# Patient Record
Sex: Female | Born: 1960 | Race: White | Hispanic: No | Marital: Married | Smoking: Former smoker
Health system: Southern US, Community
[De-identification: ages and names within clinical notes are randomized; demographics above are authoritative.]

## PROBLEM LIST (undated history)

## (undated) DIAGNOSIS — E119 Type 2 diabetes mellitus without complications: Secondary | ICD-10-CM

## (undated) DIAGNOSIS — J45909 Unspecified asthma, uncomplicated: Secondary | ICD-10-CM

## (undated) DIAGNOSIS — J449 Chronic obstructive pulmonary disease, unspecified: Secondary | ICD-10-CM

## (undated) DIAGNOSIS — R000 Tachycardia, unspecified: Secondary | ICD-10-CM

## (undated) DIAGNOSIS — G8929 Other chronic pain: Secondary | ICD-10-CM

## (undated) DIAGNOSIS — I10 Essential (primary) hypertension: Secondary | ICD-10-CM

## (undated) DIAGNOSIS — F419 Anxiety disorder, unspecified: Secondary | ICD-10-CM

## (undated) HISTORY — PX: TUBAL LIGATION: SHX77

## (undated) HISTORY — DX: Anxiety disorder, unspecified: F41.9

## (undated) HISTORY — DX: Tachycardia, unspecified: R00.0

## (undated) HISTORY — DX: Unspecified asthma, uncomplicated: J45.909

## (undated) HISTORY — PX: ECTOPIC PREGNANCY SURGERY: SHX613

## (undated) HISTORY — DX: Other chronic pain: G89.29

---

## 2000-06-18 ENCOUNTER — Emergency Department (HOSPITAL_COMMUNITY): Admission: EM | Admit: 2000-06-18 | Discharge: 2000-06-18 | Payer: Self-pay | Admitting: Emergency Medicine

## 2002-03-11 ENCOUNTER — Encounter: Payer: Self-pay | Admitting: Emergency Medicine

## 2002-03-11 ENCOUNTER — Emergency Department (HOSPITAL_COMMUNITY): Admission: EM | Admit: 2002-03-11 | Discharge: 2002-03-11 | Payer: Self-pay | Admitting: Emergency Medicine

## 2002-05-18 ENCOUNTER — Encounter: Payer: Self-pay | Admitting: Emergency Medicine

## 2002-05-18 ENCOUNTER — Emergency Department (HOSPITAL_COMMUNITY): Admission: EM | Admit: 2002-05-18 | Discharge: 2002-05-18 | Payer: Self-pay | Admitting: Emergency Medicine

## 2004-06-27 ENCOUNTER — Emergency Department (HOSPITAL_COMMUNITY): Admission: EM | Admit: 2004-06-27 | Discharge: 2004-06-27 | Payer: Self-pay | Admitting: Emergency Medicine

## 2004-09-13 ENCOUNTER — Ambulatory Visit (HOSPITAL_COMMUNITY): Admission: RE | Admit: 2004-09-13 | Discharge: 2004-09-13 | Payer: Self-pay | Admitting: Obstetrics

## 2005-01-02 ENCOUNTER — Encounter: Admission: RE | Admit: 2005-01-02 | Discharge: 2005-01-02 | Payer: Self-pay | Admitting: Nephrology

## 2005-01-02 ENCOUNTER — Ambulatory Visit (HOSPITAL_COMMUNITY): Admission: RE | Admit: 2005-01-02 | Discharge: 2005-01-02 | Payer: Self-pay | Admitting: Nephrology

## 2005-01-23 ENCOUNTER — Encounter: Admission: RE | Admit: 2005-01-23 | Discharge: 2005-01-23 | Payer: Self-pay | Admitting: Nephrology

## 2005-10-14 ENCOUNTER — Emergency Department (HOSPITAL_COMMUNITY): Admission: EM | Admit: 2005-10-14 | Discharge: 2005-10-14 | Payer: Self-pay | Admitting: Emergency Medicine

## 2005-10-20 ENCOUNTER — Emergency Department (HOSPITAL_COMMUNITY): Admission: EM | Admit: 2005-10-20 | Discharge: 2005-10-20 | Payer: Self-pay | Admitting: Emergency Medicine

## 2007-02-02 ENCOUNTER — Emergency Department (HOSPITAL_COMMUNITY): Admission: EM | Admit: 2007-02-02 | Discharge: 2007-02-02 | Payer: Self-pay | Admitting: Emergency Medicine

## 2011-11-14 ENCOUNTER — Emergency Department (HOSPITAL_COMMUNITY): Payer: Self-pay

## 2011-11-14 ENCOUNTER — Emergency Department (HOSPITAL_COMMUNITY)
Admission: EM | Admit: 2011-11-14 | Discharge: 2011-11-14 | Disposition: A | Discharge status: Home / Self Care | Payer: Self-pay | Attending: Emergency Medicine | Admitting: Emergency Medicine

## 2011-11-14 ENCOUNTER — Encounter (HOSPITAL_COMMUNITY): Payer: Self-pay | Admitting: *Deleted

## 2011-11-14 ENCOUNTER — Other Ambulatory Visit: Payer: Self-pay

## 2011-11-14 DIAGNOSIS — R000 Tachycardia, unspecified: Secondary | ICD-10-CM | POA: Insufficient documentation

## 2011-11-14 DIAGNOSIS — R0989 Other specified symptoms and signs involving the circulatory and respiratory systems: Secondary | ICD-10-CM | POA: Insufficient documentation

## 2011-11-14 DIAGNOSIS — R0609 Other forms of dyspnea: Secondary | ICD-10-CM | POA: Insufficient documentation

## 2011-11-14 DIAGNOSIS — Z79899 Other long term (current) drug therapy: Secondary | ICD-10-CM | POA: Insufficient documentation

## 2011-11-14 DIAGNOSIS — R0682 Tachypnea, not elsewhere classified: Secondary | ICD-10-CM | POA: Insufficient documentation

## 2011-11-14 DIAGNOSIS — R079 Chest pain, unspecified: Secondary | ICD-10-CM | POA: Insufficient documentation

## 2011-11-14 DIAGNOSIS — J4489 Other specified chronic obstructive pulmonary disease: Secondary | ICD-10-CM | POA: Insufficient documentation

## 2011-11-14 DIAGNOSIS — J40 Bronchitis, not specified as acute or chronic: Secondary | ICD-10-CM

## 2011-11-14 DIAGNOSIS — R0602 Shortness of breath: Secondary | ICD-10-CM | POA: Insufficient documentation

## 2011-11-14 DIAGNOSIS — I10 Essential (primary) hypertension: Secondary | ICD-10-CM | POA: Insufficient documentation

## 2011-11-14 DIAGNOSIS — F17200 Nicotine dependence, unspecified, uncomplicated: Secondary | ICD-10-CM | POA: Insufficient documentation

## 2011-11-14 DIAGNOSIS — J449 Chronic obstructive pulmonary disease, unspecified: Secondary | ICD-10-CM | POA: Insufficient documentation

## 2011-11-14 HISTORY — DX: Essential (primary) hypertension: I10

## 2011-11-14 HISTORY — DX: Chronic obstructive pulmonary disease, unspecified: J44.9

## 2011-11-14 LAB — DIFFERENTIAL
Eosinophils Absolute: 0.2 10*3/uL (ref 0.0–0.7)
Eosinophils Relative: 1 % (ref 0–5)
Lymphocytes Relative: 9 % — ABNORMAL LOW (ref 12–46)
Monocytes Absolute: 0.9 10*3/uL (ref 0.1–1.0)
Monocytes Relative: 6 % (ref 3–12)

## 2011-11-14 LAB — COMPREHENSIVE METABOLIC PANEL
Albumin: 3.5 g/dL (ref 3.5–5.2)
Calcium: 10 mg/dL (ref 8.4–10.5)
Creatinine, Ser: 0.5 mg/dL (ref 0.50–1.10)
Sodium: 132 mEq/L — ABNORMAL LOW (ref 135–145)

## 2011-11-14 LAB — CK TOTAL AND CKMB (NOT AT ARMC)
Relative Index: INVALID (ref 0.0–2.5)
Total CK: 43 U/L (ref 7–177)

## 2011-11-14 LAB — CBC
HCT: 41.6 % (ref 36.0–46.0)
Hemoglobin: 14.7 g/dL (ref 12.0–15.0)
MCV: 98.8 fL (ref 78.0–100.0)
Platelets: 254 10*3/uL (ref 150–400)
RBC: 4.21 MIL/uL (ref 3.87–5.11)
WBC: 14.6 10*3/uL — ABNORMAL HIGH (ref 4.0–10.5)

## 2011-11-14 LAB — POCT I-STAT TROPONIN I: Troponin i, poc: 0.01 ng/mL (ref 0.00–0.08)

## 2011-11-14 MED ORDER — ALBUTEROL SULFATE HFA 108 (90 BASE) MCG/ACT IN AERS
INHALATION_SPRAY | RESPIRATORY_TRACT | Status: AC
  Filled 2011-11-14: qty 6.7

## 2011-11-14 MED ORDER — AZITHROMYCIN 250 MG PO TABS: 250.0000 mg | ORAL_TABLET | Freq: Every day | ORAL | Status: AC

## 2011-11-14 MED ORDER — SODIUM CHLORIDE 0.9 % IV BOLUS (SEPSIS)
1000.0000 mL | Freq: Once | INTRAVENOUS | Status: AC
  Administered 2011-11-14: 1000 mL via INTRAVENOUS

## 2011-11-14 MED ORDER — IOHEXOL 350 MG/ML SOLN
100.0000 mL | Freq: Once | INTRAVENOUS | Status: AC | PRN
  Administered 2011-11-14: 100 mL via INTRAVENOUS

## 2011-11-14 MED ORDER — TRAMADOL HCL 50 MG PO TABS: 50.0000 mg | ORAL_TABLET | Freq: Three times a day (TID) | ORAL | Status: AC | PRN

## 2011-11-14 MED ORDER — ALBUTEROL SULFATE (5 MG/ML) 0.5% IN NEBU
2.5000 mg | INHALATION_SOLUTION | RESPIRATORY_TRACT | Status: AC
  Administered 2011-11-14: 2.5 mg via RESPIRATORY_TRACT
  Filled 2011-11-14: qty 0.5

## 2011-11-14 MED ORDER — TRAMADOL HCL 50 MG PO TABS
50.0000 mg | ORAL_TABLET | Freq: Once | ORAL | Status: AC
  Administered 2011-11-14: 50 mg via ORAL
  Filled 2011-11-14: qty 1

## 2011-11-14 MED ORDER — ALBUTEROL SULFATE HFA 108 (90 BASE) MCG/ACT IN AERS
2.0000 | INHALATION_SPRAY | RESPIRATORY_TRACT | Status: DC | PRN
  Administered 2011-11-14: 2 via RESPIRATORY_TRACT

## 2011-11-14 MED ORDER — PREDNISONE 20 MG PO TABS
60.0000 mg | ORAL_TABLET | Freq: Once | ORAL | Status: AC
  Administered 2011-11-14: 60 mg via ORAL
  Filled 2011-11-14: qty 3

## 2011-11-14 MED ORDER — PREDNISONE 20 MG PO TABS: 60.0000 mg | ORAL_TABLET | Freq: Once | ORAL | Status: AC

## 2011-11-14 MED ORDER — AZITHROMYCIN 250 MG PO TABS
500.0000 mg | ORAL_TABLET | Freq: Once | ORAL | Status: AC
  Administered 2011-11-14: 500 mg via ORAL
  Filled 2011-11-14: qty 2

## 2011-11-14 NOTE — Discharge Instructions (Signed)

## 2011-11-14 NOTE — ED Notes (Signed)
Patient transported to CT 

## 2011-11-14 NOTE — ED Notes (Signed)
The pt has had lt lower rib pain and posterior chest pain  For 2-3 dats with chills and a temp with a productive cough.

## 2011-11-14 NOTE — ED Notes (Signed)
[facility] [phone] called for clarification of Rx for prednisone.  Dr Gwyneth Sprout consulted and Rx for Prednisone changed to "Prednisone 20 mg tablet, take 3 tablets (60 mg) by mouth once daily x 5 days # 15"  Order clarified w/pharmacy

## 2011-11-14 NOTE — ED Notes (Signed)
MD at bedside.- dr. Jeraldine Loots

## 2011-11-14 NOTE — ED Provider Notes (Signed)
History     CSN: [id number]  Arrival date & time 11/14/11  0018   First MD Initiated Contact with Patient 11/14/11 0256      Chief Complaint  Patient presents with  . Chest Pain     HPI The patient presents with chest pain.  She notes her symptoms began approximately 3 days ago, gradually.  Since onset she has had pain focally about the left side of her lower sternum, with mild radiation to the posterior.  The pain is worse with inspiration and coughing.  She notes mild dyspnea, worse than her baseline.  She notes mild improvement with albuterol inhaler.  No clear exacerbating factors beyond the deep inspiration.  She also notes a subjective fever, no objective fever.  No vomiting, no diarrhea.    Past Medical History  Diagnosis Date  . COPD (chronic obstructive pulmonary disease)   . Hypertension     No past surgical history on file.  History reviewed. No pertinent family history.  History  Substance Use Topics  . Smoking status: Current Everyday Smoker  . Smokeless tobacco: Not on file  . Alcohol Use: Yes    OB History    Grav Para Term Preterm Abortions TAB SAB Ect Mult Living                  Review of Systems  Constitutional:       HPI  HENT:       HPI otherwise negative  Eyes: Negative.   Respiratory:       HPI, otherwise negative  Cardiovascular:       HPI, otherwise nmegative  Gastrointestinal: Negative for vomiting.  Genitourinary:       HPI, otherwise negative  Musculoskeletal:       HPI, otherwise negative  Skin: Negative.   Neurological: Negative for syncope.    Allergies  Review of patient's allergies indicates no known allergies.  Home Medications   Current Outpatient Rx  Name Route Sig Dispense Refill  . ALBUTEROL SULFATE HFA 108 (90 BASE) MCG/ACT IN AERS Inhalation Inhale 2 puffs into the lungs every 6 (six) hours as needed.    Marland Kitchen CLONIDINE HCL 0.2 MG PO TABS Oral Take 0.4 mg by mouth 2 (two) times daily.    . TRIAMTERENE-HCTZ 37.5-25  MG PO CAPS Oral Take 1 capsule by mouth every morning.      BP 124/90  Pulse 109  Temp(Src) 97.9 F (36.6 C) (Oral)  Resp 23  SpO2 95%  Physical Exam  Nursing note and vitals reviewed. Constitutional: She is oriented to person, place, and time. She appears well-developed and well-nourished. No distress.  HENT:  Head: Normocephalic and atraumatic.  Eyes: Conjunctivae and EOM are normal.  Cardiovascular: Regular rhythm.  Tachycardia present.   Pulmonary/Chest: No stridor. Tachypnea noted. No respiratory distress. She has wheezes.  Abdominal: She exhibits no distension.  Musculoskeletal: She exhibits no edema.  Neurological: She is alert and oriented to person, place, and time. No cranial nerve deficit.  Skin: Skin is warm and dry.  Psychiatric: She has a normal mood and affect.    ED Course  Procedures (including critical care time)  Labs Reviewed  CBC - Abnormal; Notable for the following:    WBC 14.6 (*)    MCH 34.9 (*)    All other components within normal limits  DIFFERENTIAL - Abnormal; Notable for the following:    Neutrophils Relative 84 (*)    Neutro Abs 12.2 (*)    Lymphocytes  Relative 9 (*)    All other components within normal limits  POCT I-STAT TROPONIN I  COMPREHENSIVE METABOLIC PANEL  CK TOTAL AND CKMB   Dg Chest 2 View  11/14/2011  *RADIOLOGY REPORT*  Clinical Data: Chest pain and shortness of breath.  History of hypertension and emphysema.  CHEST - 2 VIEW  Comparison: 02/02/07  Findings: Normal heart size and pulmonary vascularity. Emphysematous changes and diffuse fibrosis throughout the lungs. There is a vague nodular opacity in the left lower lung, new since the previous study.  This could represent overlapping shadows but developing nodule should be excluded and CT is recommended.  No focal airspace consolidation.  No blunting of costophrenic angles. No pneumothorax.  IMPRESSION: Emphysema and fibrosis in the lungs.  Vague nodular opacity over the left  lung which may represent overlapping shadows but CT is recommended to exclude significant developing pulmonary nodule.  Original Report Authenticated By: Marlon Pel, M.D.   X-ray reviewed by me  No diagnosis found.  Pulse ox 94% room air, abnormal Cardiac monitor: 95 sr - normal   Date: 11/14/2011  Rate: 114  Rhythm: sinus tachycardia  QRS Axis: normal  Intervals: normal  ST/T Wave abnormalities: normal  Conduction Disutrbances:none  Narrative Interpretation:   Old EKG Reviewed: none available  Smoking cessation counseling provided  MDM  This elderly appearing female now presents with several days of cough, pain, chills.  On exam the patient is in no distress, is mildly hypoxic.  The patient improved substantially following albuterol treatment.  The patient's x-ray, story, smoking history oral system with acute bronchitis, likely superimposed on smoking related changes.  The patient's ECG was nonischemic, though it was tachycardic.  The patient was discharged in stable condition to follow up with her primary care physician        Gerhard Munch, MD 11/14/11 [phone]

## 2012-03-26 ENCOUNTER — Other Ambulatory Visit: Payer: Self-pay | Admitting: Nephrology

## 2012-05-14 ENCOUNTER — Other Ambulatory Visit: Payer: Self-pay | Admitting: Family Medicine

## 2012-05-14 ENCOUNTER — Ambulatory Visit
Admission: RE | Admit: 2012-05-14 | Discharge: 2012-05-14 | Disposition: A | Discharge status: Home / Self Care | Payer: PRIVATE HEALTH INSURANCE | Source: Ambulatory Visit | Attending: Family Medicine | Admitting: Family Medicine

## 2012-05-14 DIAGNOSIS — Z0000 Encounter for general adult medical examination without abnormal findings: Secondary | ICD-10-CM

## 2012-05-14 DIAGNOSIS — Z006 Encounter for examination for normal comparison and control in clinical research program: Secondary | ICD-10-CM

## 2014-12-17 ENCOUNTER — Emergency Department (HOSPITAL_COMMUNITY)
Admission: EM | Admit: 2014-12-17 | Discharge: 2014-12-17 | Disposition: A | Discharge status: Home / Self Care | Payer: Medicaid Other | Source: Home / Self Care | Attending: Emergency Medicine | Admitting: Emergency Medicine

## 2014-12-17 ENCOUNTER — Encounter (HOSPITAL_COMMUNITY): Payer: Self-pay | Admitting: *Deleted

## 2014-12-17 ENCOUNTER — Emergency Department (INDEPENDENT_AMBULATORY_CARE_PROVIDER_SITE_OTHER): Payer: Medicaid Other

## 2014-12-17 DIAGNOSIS — J189 Pneumonia, unspecified organism: Secondary | ICD-10-CM

## 2014-12-17 MED ORDER — ACETAMINOPHEN 325 MG PO TABS
ORAL_TABLET | ORAL | Status: AC
  Filled 2014-12-17: qty 2

## 2014-12-17 MED ORDER — ACETAMINOPHEN 325 MG PO TABS
650.0000 mg | ORAL_TABLET | Freq: Once | ORAL | Status: AC
  Administered 2014-12-17: 650 mg via ORAL

## 2014-12-17 MED ORDER — LEVOFLOXACIN 500 MG PO TABS: 500.0000 mg | ORAL_TABLET | Freq: Every day | ORAL | Status: DC

## 2014-12-17 MED ORDER — ALBUTEROL SULFATE (2.5 MG/3ML) 0.083% IN NEBU
INHALATION_SOLUTION | RESPIRATORY_TRACT | Status: AC
  Filled 2014-12-17: qty 6

## 2014-12-17 MED ORDER — PREDNISONE 50 MG PO TABS: ORAL_TABLET | ORAL | Status: DC

## 2014-12-17 MED ORDER — IPRATROPIUM-ALBUTEROL 0.5-2.5 (3) MG/3ML IN SOLN
3.0000 mL | Freq: Once | RESPIRATORY_TRACT | Status: AC
  Administered 2014-12-17: 3 mL via RESPIRATORY_TRACT

## 2014-12-17 MED ORDER — IPRATROPIUM BROMIDE 0.02 % IN SOLN
RESPIRATORY_TRACT | Status: AC
  Filled 2014-12-17: qty 2.5

## 2014-12-17 MED ORDER — ALBUTEROL SULFATE (2.5 MG/3ML) 0.083% IN NEBU
2.5000 mg | INHALATION_SOLUTION | Freq: Once | RESPIRATORY_TRACT | Status: AC
  Administered 2014-12-17: 2.5 mg via RESPIRATORY_TRACT

## 2014-12-17 NOTE — ED Notes (Signed)
Pt      Reports         Shortness  Of  Breath              Cough       And  Congested           Symptoms  X  3  Days               Pain  In  Back         And  Sides as   Well     Pt  Sitting  Upright  On  The  Exam table  Speaking in  Complete  sentances  Pt is  A  Smoker

## 2014-12-17 NOTE — Discharge Instructions (Signed)
You have pneumonia. Take Levaquin daily for 7 days. Take prednisone daily for 5 days. Use your albuterol and Atrovent as needed. Alternate Tylenol and ibuprofen every 4 hours for body aches and fever. If you are having worsening shortness of breath or unable to tolerate the medications, please go to the emergency room. Follow-up with your PCP in about one week for recheck.

## 2014-12-17 NOTE — ED Provider Notes (Signed)
CSN: [id number]     Arrival date & time 12/17/14  1446 History   First MD Initiated Contact with Patient 12/17/14 1614     Chief Complaint  Patient presents with  . Cough   (Consider location/radiation/quality/duration/timing/severity/associated sxs/prior Treatment) HPI  She is a 54 year old woman here for evaluation of cough and body aches. She states for the last 1-2 days she has had fever, cough, body aches. She is a current smoker and has a history of COPD. The cough is productive, but nothing different than her usual. She is more short of breath than normal. She reports wheezing. She's been using her albuterol and Atrovent inhalers with improvement. She has taken some Goody powders with improvement of fever. She also reports sharp pain under the right breast with deep breaths and coughing. She states that she holds the area of the pain is better. The pain will radiate around the right rib cage. She reports some mild nausea, but no vomiting.  Past Medical History  Diagnosis Date  . COPD (chronic obstructive pulmonary disease)   . Hypertension    History reviewed. No pertinent past surgical history. History reviewed. No pertinent family history. History  Substance Use Topics  . Smoking status: Current Every Day Smoker  . Smokeless tobacco: Not on file  . Alcohol Use: Yes   OB History    No data available     Review of Systems  Constitutional: Positive for fever and chills.  HENT: Negative for congestion, rhinorrhea and sore throat.   Respiratory: Positive for cough, shortness of breath and wheezing.   Cardiovascular: Positive for chest pain (with cough).  Gastrointestinal: Positive for nausea. Negative for vomiting.  Musculoskeletal: Positive for myalgias.  Neurological: Negative for headaches.    Allergies  Review of patient's allergies indicates no known allergies.  Home Medications   Prior to Admission medications   Medication Sig Start Date End Date Taking?  Authorizing Provider  acetaminophen (TYLENOL) 325 MG tablet Take 325 mg by mouth every 6 (six) hours as needed. fever    Historical Provider, MD  albuterol (PROVENTIL HFA;VENTOLIN HFA) 108 (90 BASE) MCG/ACT inhaler Inhale 2 puffs into the lungs every 6 (six) hours as needed.    Historical Provider, MD  aspirin 325 MG tablet Take 325 mg by mouth once. fever    Historical Provider, MD  Aspirin-Acetaminophen-Caffeine (GOODY HEADACHE PO) Take 0.5 packets by mouth every 8 (eight) hours as needed. fever    Historical Provider, MD  clonazePAM (KLONOPIN) 0.5 MG tablet Take 0.25 mg by mouth 2 (two) times daily. Scheduled doses    Historical Provider, MD  cloNIDine (CATAPRES) 0.2 MG tablet Take 0.4 mg by mouth at bedtime.     Historical Provider, MD  ibuprofen (ADVIL,MOTRIN) 200 MG tablet Take 200 mg by mouth every 6 (six) hours as needed. fever    Historical Provider, MD  levofloxacin (LEVAQUIN) 500 MG tablet Take 1 tablet (500 mg total) by mouth daily. 12/17/14   Charm Rings, MD  polyvinyl alcohol (LIQUIFILM TEARS) 1.4 % ophthalmic solution Place 1 drop into both eyes as needed. Dry eyes    Historical Provider, MD  predniSONE (DELTASONE) 50 MG tablet Take 1 pill daily for 5 days. 12/17/14   Charm Rings, MD  triamterene-hydrochlorothiazide (DYAZIDE) 37.5-25 MG per capsule Take 1 capsule by mouth every morning.    Historical Provider, MD   BP 144/86 mmHg  Pulse 112  Temp(Src) 100.7 F (38.2 C) (Oral)  Resp 18  SpO2 94% Physical  Exam  Constitutional: She is oriented to person, place, and time. She appears well-developed and well-nourished. She appears distressed (Mild).  HENT:  Nose: Nose normal.  Mouth/Throat: Oropharynx is clear and moist. No oropharyngeal exudate.  Neck: Neck supple.  Cardiovascular: Regular rhythm and normal heart sounds.  Tachycardia present.   No murmur heard. Pulmonary/Chest: Effort normal. No respiratory distress. She has wheezes (Diffuse). She exhibits tenderness (Underneath  right breast).  Lymphadenopathy:    She has no cervical adenopathy.  Neurological: She is alert and oriented to person, place, and time.  Skin: Skin is warm and dry.    ED Course  Procedures (including critical care time) Labs Review Labs Reviewed - No data to display  Imaging Review Dg Chest 2 View  12/17/2014   CLINICAL DATA:  Shortness of breath. Stabbing pains in the right side of the chest. Dry cough.  EXAM: CHEST - 2 VIEW  COMPARISON:  One-view chest 05/14/2012  FINDINGS: The heart size is normal. Right middle lobe airspace disease is new. The left lung is clear. The visualized soft tissues and bony thorax are unremarkable.  IMPRESSION: New right middle lobe pneumonia.   Electronically Signed   By: Marin Robertshristopher  Mattern M.D.   On: 12/17/2014 17:16     MDM   1. CAP (community acquired pneumonia)    DuoNeb 0.5-5 mg given. Tylenol 650 mg given.  She reports improvement after the Tylenol and breathing treatment. She is moving air much better. She does still have some wheezing in the right lower lobe. X-ray shows pneumonia. We'll treat with Levaquin and prednisone. Recommended alternating Tylenol and ibuprofen for fever and body aches. Return precautions reviewed.    Charm RingsErin J Shalah Estelle, MD 12/17/14 1728

## 2014-12-24 ENCOUNTER — Other Ambulatory Visit: Payer: Self-pay | Admitting: Family Medicine

## 2014-12-24 DIAGNOSIS — N889 Noninflammatory disorder of cervix uteri, unspecified: Secondary | ICD-10-CM

## 2015-01-03 ENCOUNTER — Ambulatory Visit
Admission: RE | Admit: 2015-01-03 | Discharge: 2015-01-03 | Disposition: A | Discharge status: Home / Self Care | Payer: Medicaid Other | Source: Ambulatory Visit | Attending: Family Medicine | Admitting: Family Medicine

## 2015-01-03 DIAGNOSIS — N889 Noninflammatory disorder of cervix uteri, unspecified: Secondary | ICD-10-CM

## 2015-03-24 ENCOUNTER — Encounter: Payer: Self-pay | Admitting: *Deleted

## 2015-03-25 ENCOUNTER — Encounter: Payer: Self-pay | Admitting: Internal Medicine

## 2015-03-25 ENCOUNTER — Ambulatory Visit (INDEPENDENT_AMBULATORY_CARE_PROVIDER_SITE_OTHER): Payer: Medicaid Other | Admitting: Internal Medicine

## 2015-03-25 VITALS — BP 122/62 | HR 104 | Ht 59.0 in | Wt 108.8 lb

## 2015-03-25 DIAGNOSIS — F17200 Nicotine dependence, unspecified, uncomplicated: Secondary | ICD-10-CM | POA: Insufficient documentation

## 2015-03-25 DIAGNOSIS — Z720 Tobacco use: Secondary | ICD-10-CM | POA: Diagnosis not present

## 2015-03-25 DIAGNOSIS — Z8701 Personal history of pneumonia (recurrent): Secondary | ICD-10-CM

## 2015-03-25 DIAGNOSIS — R0600 Dyspnea, unspecified: Secondary | ICD-10-CM

## 2015-03-25 DIAGNOSIS — J431 Panlobular emphysema: Secondary | ICD-10-CM | POA: Diagnosis not present

## 2015-03-25 DIAGNOSIS — R0689 Other abnormalities of breathing: Secondary | ICD-10-CM

## 2015-03-25 MED ORDER — PREDNISONE 10 MG PO TABS: ORAL_TABLET | ORAL | Status: DC

## 2015-03-25 NOTE — Progress Notes (Signed)
Subjective:    Patient ID: Sonia Bush, female    DOB: 1961-09-20, 54 y.o.   MRN: [id number]  PCP Aida Puffer, MD   HPI  IOV 03/25/2015  Chief Complaint  Patient presents with  . Pulmonary Consult    Pt referred by Dr. Aida Puffer for DOE. Pt states she has COPD.    [name] referred by primary care physician for evaluation of COPD. Also chart reviewed but there is no specific information. Patient herself reports a diagnosis of COPD/emphysema many years ago. She says that for the last 20 years or so she's had chronic insidious onset of shortness of breath, cough, wheezing, chest tightness, white sputum production. All these have been steadily slowly progressive over the last several years. Currently symptoms are moderate to severe in intensity. There is no acute decompensation or colored sputum. Her husband was a patient of mine [employer] and he recently passed away from sarcoma of the lung. I was the original pulmonologist who did the biopsy on him. Therefore she has opted to follow-up with me for this new evaluation. She does not have any chest pain associated with these.    Radiologic evaluation shows right middle lobe pneumonia March 2016 on a chest x-ray that I personally visualized  In February 2013 she did have CT angiographic of the chest that confirms a significant emphysema in addition she had some left-sided infiltrates but I personally visualized.  She continues to smoke actively          has a past medical history of COPD (chronic obstructive pulmonary disease); Hypertension; Anxiety; Chronic pain; and Asthmatic bronchitis.   reports that she has been smoking Cigarettes.  She has a 19.5 pack-year smoking history. She has never used smokeless tobacco.  Past Surgical History  Procedure Laterality Date  . Ectopic pregnancy surgery    . Tubal ligation      No Known Allergies  Immunization History  Administered Date(s) Administered  .  Influenza Split 08/01/2014    Family History  Problem Relation Age of Onset  . Asthma Daughter   . Asthma Daughter   . Skin cancer Father   . Alzheimer's disease Father      Current outpatient prescriptions:  .  acetaminophen (TYLENOL) 325 MG tablet, Take 325 mg by mouth every 6 (six) hours as needed. fever, Disp: , Rfl:  .  albuterol (PROVENTIL HFA;VENTOLIN HFA) 108 (90 BASE) MCG/ACT inhaler, Inhale 2 puffs into the lungs every 6 (six) hours as needed., Disp: , Rfl:  .  Aspirin-Acetaminophen-Caffeine (GOODY HEADACHE PO), Take 0.5 packets by mouth every 8 (eight) hours as needed. fever, Disp: , Rfl:  .  clonazePAM (KLONOPIN) 0.5 MG tablet, Take 0.25 mg by mouth 2 (two) times daily. Scheduled doses, Disp: , Rfl:  .  cloNIDine (CATAPRES) 0.2 MG tablet, Take 0.4 mg by mouth at bedtime. , Disp: , Rfl:  .  ibuprofen (ADVIL,MOTRIN) 200 MG tablet, Take 200 mg by mouth every 6 (six) hours as needed. fever, Disp: , Rfl:  .  ipratropium (ATROVENT HFA) 17 MCG/ACT inhaler, Inhale 2 puffs into the lungs every 6 (six) hours., Disp: , Rfl:  .  polyvinyl alcohol (LIQUIFILM TEARS) 1.4 % ophthalmic solution, Place 1 drop into both eyes as needed. Dry eyes, Disp: , Rfl:  .  QUEtiapine (SEROQUEL) 25 MG tablet, Take 25 mg by mouth at bedtime., Disp: , Rfl:  .  triamterene-hydrochlorothiazide (DYAZIDE) 37.5-25 MG per capsule, Take 1 capsule by mouth every morning., Disp: ,  Rfl:     Review of Systems  Constitutional: Negative for fever and unexpected weight change.  HENT: Negative for congestion, dental problem, ear pain, nosebleeds, postnasal drip, rhinorrhea, sinus pressure, sneezing, sore throat and trouble swallowing.   Eyes: Negative for redness and itching.  Respiratory: Positive for cough, chest tightness and shortness of breath. Negative for wheezing.   Cardiovascular: Negative for palpitations and leg swelling.  Gastrointestinal: Negative for nausea and vomiting.  Genitourinary: Negative for  dysuria.  Musculoskeletal: Negative for joint swelling.  Skin: Negative for rash.  Neurological: Negative for headaches.  Hematological: Does not bruise/bleed easily.  Psychiatric/Behavioral: Negative for dysphoric mood. The patient is not nervous/anxious.        Objective:   Physical Exam  Constitutional: She is oriented to person, place, and time. She appears well-developed and well-nourished. No distress.  HENT:  Head: Normocephalic and atraumatic.  Right Ear: External ear normal.  Left Ear: External ear normal.  Mouth/Throat: Oropharynx is clear and moist. No oropharyngeal exudate.  Eyes: Conjunctivae and EOM are normal. Pupils are equal, round, and reactive to light. Right eye exhibits no discharge. Left eye exhibits no discharge. No scleral icterus.  Neck: Normal range of motion. Neck supple. No JVD present. No tracheal deviation present. No thyromegaly present.  Cardiovascular: Normal rate, regular rhythm, normal heart sounds and intact distal pulses.  Exam reveals no gallop and no friction rub.   No murmur heard. Pulmonary/Chest: Effort normal. No respiratory distress. She has wheezes. She has no rales. She exhibits no tenderness.  Scattered wheeze +  Abdominal: Soft. Bowel sounds are normal. She exhibits no distension and no mass. There is no tenderness. There is no rebound and no guarding.  Musculoskeletal: Normal range of motion. She exhibits no edema or tenderness.  Lymphadenopathy:    She has no cervical adenopathy.  Neurological: She is alert and oriented to person, place, and time. She has normal reflexes. No cranial nerve deficit. She exhibits normal muscle tone. Coordination normal.  Skin: Skin is warm and dry. No rash noted. She is not diaphoretic. No erythema. No pallor.  Psychiatric: She has a normal mood and affect. Her behavior is normal. Judgment and thought content normal.  Vitals reviewed.   Filed Vitals:   03/25/15 1425  BP: 122/62  Pulse: 104  Height:   (1.499 m)  Weight: 108 lb 12.8 oz (49.351 kg)  SpO2: 93%         Assessment & Plan:     ICD-9-CM ICD-10-CM   1. Dyspnea and respiratory abnormality 786.09 R06.00     R06.89   2. 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 Smoking 305.1 Z72.0   4. History of pneumonia V12.61 Z87.01    Has copd/emphysema  - likelyseverey stage. Actively symptomatic due to lack of maintenance Rx.   Do full PFT next week or so Do CT chest wo contrast next week or so Do Please take prednisone 40 mg x1 day, then 30 mg x1 day, then 20 mg x1 day, then 10 mg x1 day, and then 5 mg x1 day and stop No change in current inhaler therapy of atrovent but change at followup based on relief with pred and PFT/CT results Slowly work on quitting smoking  Followup  1 to 2 weeks after completion of above with me or my NP Tammy Parrett Walk test at followup   Dr. Kalman Shan, M.D., [facility].C.P Pulmonary and Critical Care Medicine Staff Physician [place]  [facility] [place] [facility] Pager:  [phone], If no answer or between  15:00h - 7:00h: call [phone]  03/25/2015 2:54 PM

## 2015-03-25 NOTE — Patient Instructions (Addendum)
ICD-9-CM ICD-10-CM   1. Dyspnea and respiratory abnormality 786.09 R06.00     R06.89   2. 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 Smoking 305.1 Z72.0   4. History of pneumonia V12.61 Z87.01     Do COPD CAT score Do full PFT next week or so Do CT chest wo contrast next week or so Do Please take prednisone 40 mg x1 day, then 30 mg x1 day, then 20 mg x1 day, then 10 mg x1 day, and then 5 mg x1 day and stop No change in current inhaler therapy of atrovent but change at followup based on relief with pred and PFT/CT results  Slowly work on quitting smoking  Followup  1 to 2 weeks after completion of above with me or my NP Tammy Parrett Walk test at followup

## 2015-03-30 ENCOUNTER — Ambulatory Visit (INDEPENDENT_AMBULATORY_CARE_PROVIDER_SITE_OTHER)
Admission: RE | Admit: 2015-03-30 | Discharge: 2015-03-30 | Disposition: A | Discharge status: Home / Self Care | Payer: Medicaid Other | Source: Ambulatory Visit | Attending: Internal Medicine | Admitting: Internal Medicine

## 2015-03-30 DIAGNOSIS — Z720 Tobacco use: Secondary | ICD-10-CM

## 2015-03-30 DIAGNOSIS — Z8701 Personal history of pneumonia (recurrent): Secondary | ICD-10-CM

## 2015-03-30 DIAGNOSIS — R0600 Dyspnea, unspecified: Secondary | ICD-10-CM

## 2015-03-30 DIAGNOSIS — R0689 Other abnormalities of breathing: Secondary | ICD-10-CM

## 2015-03-30 DIAGNOSIS — F17200 Nicotine dependence, unspecified, uncomplicated: Secondary | ICD-10-CM

## 2015-03-30 DIAGNOSIS — J431 Panlobular emphysema: Secondary | ICD-10-CM

## 2015-03-31 ENCOUNTER — Other Ambulatory Visit (HOSPITAL_COMMUNITY): Payer: Self-pay | Admitting: Respiratory Therapy

## 2015-03-31 ENCOUNTER — Encounter (HOSPITAL_COMMUNITY): Payer: Medicaid Other

## 2015-03-31 ENCOUNTER — Ambulatory Visit (HOSPITAL_COMMUNITY)
Admission: RE | Admit: 2015-03-31 | Discharge: 2015-03-31 | Disposition: A | Discharge status: Home / Self Care | Payer: Medicaid Other | Source: Ambulatory Visit | Attending: Internal Medicine | Admitting: Internal Medicine

## 2015-03-31 ENCOUNTER — Ambulatory Visit (HOSPITAL_COMMUNITY): Admission: RE | Admit: 2015-03-31 | Payer: Medicaid Other | Source: Ambulatory Visit

## 2015-03-31 DIAGNOSIS — R0600 Dyspnea, unspecified: Secondary | ICD-10-CM

## 2015-03-31 DIAGNOSIS — J439 Emphysema, unspecified: Secondary | ICD-10-CM

## 2015-03-31 DIAGNOSIS — R0689 Other abnormalities of breathing: Principal | ICD-10-CM

## 2015-03-31 MED ORDER — ALBUTEROL SULFATE (2.5 MG/3ML) 0.083% IN NEBU: 2.5000 mg | INHALATION_SOLUTION | Freq: Once | RESPIRATORY_TRACT | Status: DC

## 2015-03-31 MED ORDER — ALBUTEROL SULFATE (2.5 MG/3ML) 0.083% IN NEBU: 2.5000 mg | INHALATION_SOLUTION | Freq: Once | RESPIRATORY_TRACT | Status: AC

## 2015-03-31 NOTE — Progress Notes (Signed)
Respiratory  PFT note  Unable to order neb tx for pft study.  Pharmacy called.  They placed an order. Order of mar as read only.  Had some admitting and ordering issues prior to PFT study.

## 2015-03-31 NOTE — Progress Notes (Signed)
Quick Note:  ATC pt. VM box full. WCB. ______

## 2015-04-05 LAB — PULMONARY FUNCTION TEST
DL/VA % pred: 42 %
DL/VA: 1.78 ml/min/mmHg/L
DLCO unc % pred: 25 %
DLCO unc: 4.57 ml/min/mmHg
FEF 25-75 POST: 0.32 L/s
FEF 25-75 PRE: 0.18 L/s
FEF2575-%Change-Post: 84 %
FEF2575-%Pred-Post: 14 %
FEF2575-%Pred-Pre: 7 %
FEV1-%Change-Post: 29 %
FEV1-%PRED-POST: 29 %
FEV1-%Pred-Pre: 22 %
FEV1-PRE: 0.51 L
FEV1-Post: 0.66 L
FEV1FVC-%Change-Post: -12 %
FEV1FVC-%PRED-PRE: 48 %
FEV6-%CHANGE-POST: 37 %
FEV6-%PRED-PRE: 41 %
FEV6-%Pred-Post: 56 %
FEV6-POST: 1.58 L
FEV6-PRE: 1.15 L
FEV6FVC-%Change-Post: -7 %
FEV6FVC-%PRED-POST: 82 %
FEV6FVC-%Pred-Pre: 89 %
FVC-%CHANGE-POST: 48 %
FVC-%PRED-POST: 68 %
FVC-%Pred-Pre: 45 %
FVC-Post: 1.97 L
FVC-Pre: 1.33 L
POST FEV1/FVC RATIO: 34 %
POST FEV6/FVC RATIO: 80 %
PRE FEV1/FVC RATIO: 39 %
PRE FEV6/FVC RATIO: 87 %

## 2015-04-08 ENCOUNTER — Encounter: Payer: Self-pay | Admitting: Internal Medicine

## 2015-04-08 ENCOUNTER — Ambulatory Visit (INDEPENDENT_AMBULATORY_CARE_PROVIDER_SITE_OTHER): Payer: Medicaid Other | Admitting: Internal Medicine

## 2015-04-08 VITALS — BP 142/94 | HR 114 | Ht 59.0 in | Wt 109.0 lb

## 2015-04-08 DIAGNOSIS — F17200 Nicotine dependence, unspecified, uncomplicated: Secondary | ICD-10-CM

## 2015-04-08 DIAGNOSIS — Z720 Tobacco use: Secondary | ICD-10-CM

## 2015-04-08 DIAGNOSIS — Z23 Encounter for immunization: Secondary | ICD-10-CM

## 2015-04-08 DIAGNOSIS — J449 Chronic obstructive pulmonary disease, unspecified: Secondary | ICD-10-CM

## 2015-04-08 MED ORDER — TIOTROPIUM BROMIDE MONOHYDRATE 18 MCG IN CAPS
18.0000 ug | ORAL_CAPSULE | Freq: Every day | RESPIRATORY_TRACT | Status: DC
Start: 2015-04-08 — End: 2016-04-26

## 2015-04-08 MED ORDER — BUDESONIDE-FORMOTEROL FUMARATE 80-4.5 MCG/ACT IN AERO: 2.0000 | INHALATION_SPRAY | Freq: Two times a day (BID) | RESPIRATORY_TRACT | Status: DC

## 2015-04-08 NOTE — Patient Instructions (Addendum)
ICD-9-CM ICD-10-CM   1. COPD, very severe 496 J44.9   2. Smoking 305.1 Z72.0    #COPD STOP atrovent Please start spiriva 1 puff daily - take sample, script and show technique Please start symbicort 80/4.5 2 puff twice daily - take sample, script and show technique Use albuterol as neeed Refer Pulmonary rehab for gold stage 4 copd Do blood test for alpha 1 - genetic cause of copd Do Prevnar vaccine Flu shot in fall  #Smoking Quit smoking   Followup 2 months iwht NP Tammy

## 2015-04-08 NOTE — Progress Notes (Signed)
Subjective:    Patient ID: Sonia Bush, female    DOB: 12-14-1960, 54 y.o.   MRN: [id number]  HPI    PCP Aida Puffer, MD   HPI  IOV 03/25/2015  Chief Complaint  Patient presents with  . Pulmonary Consult    Pt referred by Dr. Aida Puffer for DOE. Pt states she has COPD.    [name] referred by primary care physician for evaluation of COPD. Also chart reviewed but there is no specific information. Patient herself reports a diagnosis of COPD/emphysema many years ago. She says that for the last 20 years or so she's had chronic insidious onset of shortness of breath, cough, wheezing, chest tightness, white sputum production. All these have been steadily slowly progressive over the last several years. Currently symptoms are moderate to severe in intensity. There is no acute decompensation or colored sputum. Her husband was a patient of mine [employer] and he recently passed away from sarcoma of the lung. I was the original pulmonologist who did the biopsy on him. Therefore she has opted to follow-up with me for this new evaluation. She does not have any chest pain associated with these.    Radiologic evaluation shows right middle lobe pneumonia March 2016 on a chest x-ray that I personally visualized  In February 2013 she did have CT angiographic of the chest that confirms a significant emphysema in addition she had some left-sided infiltrates but I personally visualized.  She continues to smoke actively  Recommend CT chest PFT Prednisone burst Continue current Atrovent but change depending on results  OV 04/08/2015  Chief Complaint  Patient presents with  . Follow-up    Pt here after CT and PFT. Pt states her breathing is unchanged. Pt c/o cough and discomfort in mid back during exhaling Pt denies CP/tightness.    S: Follow-up for test results. And Pred prednisone did not help her symptoms. She does not think Atrovent is helping her. Still continues to  have significant amount of symptoms  O; Tests Pulmonary function tests done 03/31/2015 shows postbronchodilator FEV1 of 0.66 L/29%, ratio of 29, DLCO of 4.57/25%. There is a 22%] letter response  Walking desaturation test today 04/08/2015: 185 feet 3 laps: She did not desaturate  CT scan of the chest 03/30/2015: Diffuse bilateral emphysema especially in the upper lobes. No nodules or cancer personally visualized film image  . Immunization History  Administered Date(s) Administered  . Influenza Split 08/01/2014      Current outpatient prescriptions:  .  acetaminophen (TYLENOL) 325 MG tablet, Take 325 mg by mouth every 6 (six) hours as needed. fever, Disp: , Rfl:  .  albuterol (PROVENTIL HFA;VENTOLIN HFA) 108 (90 BASE) MCG/ACT inhaler, Inhale 2 puffs into the lungs every 6 (six) hours as needed., Disp: , Rfl:  .  Aspirin-Acetaminophen-Caffeine (GOODY HEADACHE PO), Take 0.5 packets by mouth every 8 (eight) hours as needed. fever, Disp: , Rfl:  .  clonazePAM (KLONOPIN) 0.5 MG tablet, Take 1 mg by mouth 2 (two) times daily. Scheduled doses, Disp: , Rfl:  .  cloNIDine (CATAPRES) 0.2 MG tablet, Take 0.2 mg by mouth at bedtime. , Disp: , Rfl:  .  ibuprofen (ADVIL,MOTRIN) 200 MG tablet, Take 200 mg by mouth every 6 (six) hours as needed. fever, Disp: , Rfl:  .  ipratropium (ATROVENT HFA) 17 MCG/ACT inhaler, Inhale 2 puffs into the lungs every 6 (six) hours., Disp: , Rfl:  .  polyvinyl alcohol (LIQUIFILM TEARS) 1.4 % ophthalmic solution, Place  1 drop into both eyes as needed. Dry eyes, Disp: , Rfl:  .  predniSONE (DELTASONE) 10 MG tablet, Take 4 tabs daily x 1 day, 3 tabs daily x 1 day, 2 tabs daily x 1 day, 1 tab daily x  Day then 5 mg x 1 day then stop, Disp: 11 tablet, Rfl: 0 .  QUEtiapine (SEROQUEL) 25 MG tablet, Take 25 mg by mouth at bedtime., Disp: , Rfl:  .  triamterene-hydrochlorothiazide (DYAZIDE) 37.5-25 MG per capsule, Take 1 capsule by mouth every morning., Disp: , Rfl:  No current  facility-administered medications for this visit.  Facility-Administered Medications Ordered in Other Visits:  .  albuterol (PROVENTIL) (2.5 MG/3ML) 0.083% nebulizer solution 2.5 mg, 2.5 mg, Nebulization, Once, Kalman ShanMurali Kortlyn Koltz, MD   Review of Systems  Constitutional: Negative for fever and unexpected weight change.  HENT: Negative for congestion, dental problem, ear pain, nosebleeds, postnasal drip, rhinorrhea, sinus pressure, sneezing, sore throat and trouble swallowing.   Eyes: Negative for redness and itching.  Respiratory: Positive for cough and shortness of breath. Negative for chest tightness and wheezing.   Cardiovascular: Negative for palpitations and leg swelling.  Gastrointestinal: Negative for nausea and vomiting.  Genitourinary: Negative for dysuria.  Musculoskeletal: Negative for joint swelling.  Skin: Negative for rash.  Neurological: Negative for headaches.  Hematological: Does not bruise/bleed easily.  Psychiatric/Behavioral: Negative for dysphoric mood. The patient is not nervous/anxious.        Objective:   Physical Exam  Filed Vitals:   04/08/15 1421  BP: 142/94  Pulse: 114  Height: 4\' 11"  (1.499 m)  Weight: 109 lb (49.442 kg)  SpO2: 92%    Discussion only visit    Assessment & Plan:     ICD-9-CM ICD-10-CM   1. 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 Smoking 305.1 Z72.0 Alpha-1 antitrypsin phenotype     AMB referral to rehabilitation    #COPD She has very severe copd - explained natural hx, prognosis etc.,  PLAN STOP atrovent Please start spiriva 1 puff daily - take sample, script and show technique Please start symbicort 80/4.5 2 puff twice daily - take sample, script and show technique Use albuterol as neeed Refer Pulmonary rehab for gold stage 4 copd Do blood test for alpha 1 - genetic cause of copd Do Prevnar vaccine Flu shot in fall  #Smoking Quit smoking   Followup 2  months iwht NP Tammy   > 50% of this > 25 min visit spent in face to face counseling or coordination of care    Dr. Kalman ShanMurali Leiya Keesey, M.D., [facility].P Pulmonary and Critical Care Medicine Staff Physician [facility] Pager: [phone], If no answer or between  15:00h - 7:00h: call [phone]  04/08/2015 2:55 PM

## 2015-04-15 ENCOUNTER — Ambulatory Visit (INDEPENDENT_AMBULATORY_CARE_PROVIDER_SITE_OTHER): Payer: Medicaid Other | Admitting: Internal Medicine

## 2015-04-15 ENCOUNTER — Encounter: Payer: Self-pay | Admitting: Internal Medicine

## 2015-04-15 ENCOUNTER — Telehealth: Payer: Self-pay | Admitting: Internal Medicine

## 2015-04-15 VITALS — BP 110/64 | HR 95 | Ht 59.5 in | Wt 110.2 lb

## 2015-04-15 DIAGNOSIS — T887XXA Unspecified adverse effect of drug or medicament, initial encounter: Secondary | ICD-10-CM

## 2015-04-15 DIAGNOSIS — T50905A Adverse effect of unspecified drugs, medicaments and biological substances, initial encounter: Secondary | ICD-10-CM

## 2015-04-15 MED ORDER — PREDNISONE 10 MG PO TABS: ORAL_TABLET | ORAL | Status: DC

## 2015-04-15 NOTE — Telephone Encounter (Signed)
Called pt and appt scheduled to see MW this afternoon at 4:!5. Nothing further needed

## 2015-04-15 NOTE — Telephone Encounter (Signed)
Pt has called back & is irritated. Swollen & itching really bad. Thought she would have been called back by now. What can she do? [phone]

## 2015-04-15 NOTE — Patient Instructions (Signed)
Prednisone 10 mg take  4 each am x 2 days,   2 each am x 2 days,  1 each am x 2 days and stop.  pepcid 20 mg twice daily after meals until better and benadryl 25-50 mg at bedtime or  chlortrimeton (chlorpheniramine) 4 mg every 4 hours available over the counter (may cause drowsiness) .

## 2015-04-15 NOTE — Progress Notes (Signed)
Subjective:    Patient ID: Sonia Bush, female    DOB: 08-Jun-1961.   MRN: [id number]  HPI    PCP Aida PufferLITTLE,JAMES, MD   HPI  IOV 03/25/2015  Chief Complaint  Patient presents with  . Pulmonary Consult    Pt referred by Dr. Aida PufferJames Little for DOE. Pt states she has COPD.    Sonia Bush referred by primary care physician for evaluation of COPD. Also chart reviewed but there is no specific information. Patient herself reports a diagnosis of COPD/emphysema many years ago. She says that for the last 20 years or so she's had chronic insidious onset of shortness of breath, cough, wheezing, chest tightness, white sputum production. All these have been steadily slowly progressive over the last several years. Currently symptoms are moderate to severe in intensity. There is no acute decompensation or colored sputum. Her husband was a patient of mine [employer] and he recently passed away from sarcoma of the lung. I was the original pulmonologist who did the biopsy on him. Therefore she has opted to follow-up with me for this new evaluation. She does not have any chest pain associated with these.    Radiologic evaluation shows right middle lobe pneumonia March 2016 on a chest x-ray that I personally visualized  In February 2013 she did have CT angiographic of the chest that confirms a significant emphysema in addition she had some left-sided infiltrates but I personally visualized.  She continues to smoke actively  Recommend CT chest PFT Prednisone burst Continue current Atrovent but change depending on results  OV 04/08/2015  Chief Complaint  Patient presents with  . Follow-up    Pt here after CT and PFT. Pt states her breathing is unchanged. Pt c/o cough and discomfort in mid back during exhaling Pt denies CP/tightness.    S: Follow-up for test results. And Pred prednisone did not help her symptoms. She does not think Atrovent is helping her. Still continues to have  significant amount of symptoms  O; Tests Pulmonary function tests done 03/31/2015 shows postbronchodilator FEV1 of 0.66 L/29%, ratio of 29, DLCO of 4.57/25%. There is a 22%] letter response  Walking desaturation test today 04/08/2015: 185 feet 3 laps: She did not desaturate  CT scan of the chest 03/30/2015: Diffuse bilateral emphysema especially in the upper lobes. No nodules or cancer personally visualized film image rec STOP atrovent Please start spiriva 1 puff daily - take sample, script and show technique Please start symbicort 80/4.5 2 puff twice daily - take sample, script and show technique Use albuterol as neeed Refer Pulmonary rehab for gold stage 4 copd Do blood test for alpha 1 - genetic cause of copd> did not to to lab  Do Prevnar vaccine   04/15/2015 f/u ov/Boubacar Lerette re: prevar reaction / still smoking  Chief Complaint  Patient presents with  . Acute Visit    Pt received Prevnar 13 vaccine on 04/08/15- left arm now itching, red, and swollen. She states arm also feels sore. She has tried benadryl and topical otc ointments including hydrocortisone cream with no relief.  onset of intensely itchy red rash at site of injection w/in 1 day of rx but breathing much better, no fever or pain in shoulder, elbow or paresthesias/ weakness in hand - breathing better on spiriva and symbicort .  No obvious day to day or daytime variability or assoc  cough or cp or chest tightness, subjective wheeze or overt sinus or hb symptoms. No unusual exp hx or  h/o childhood pna/ asthma or knowledge of premature birth.  Sleeping ok without nocturnal  or early am exacerbation  of respiratory  c/o's or need for noct saba. Also denies any obvious fluctuation of symptoms with weather or environmental changes or other aggravating or alleviating factors except as outlined above   Current Medications, Allergies, Complete Past Medical History, Past Surgical History, Family History, and Social History were reviewed in  [employer] record.  ROS  The following are not active complaints unless bolded sore throat, dysphagia, dental problems, itching, sneezing,  nasal congestion or excess/ purulent secretions, ear ache,   fever, chills, sweats, unintended wt loss, classically pleuritic or exertional cp, hemoptysis,  orthopnea pnd or leg swelling, presyncope, palpitations, abdominal pain, anorexia, nausea, vomiting, diarrhea  or change in bowel or bladder habits, change in stools or urine, dysuria,hematuria,  rash, arthralgias, visual complaints, headache, numbness, weakness or ataxia or problems with walking or coordination,  change in mood/affect or memory.            . Immunization History  Administered Date(s) Administered  . Influenza Split 08/01/2014      Current outpatient prescriptions:  .  acetaminophen (TYLENOL) 325 MG tablet, Take 325 mg by mouth every 6 (six) hours as needed. fever, Disp: , Rfl:  .  albuterol (PROVENTIL HFA;VENTOLIN HFA) 108 (90 BASE) MCG/ACT inhaler, Inhale 2 puffs into the lungs every 6 (six) hours as needed., Disp: , Rfl:  .  Aspirin-Acetaminophen-Caffeine (GOODY HEADACHE PO), Take 0.5 packets by mouth every 8 (eight) hours as needed. fever, Disp: , Rfl:  .  clonazePAM (KLONOPIN) 0.5 MG tablet, Take 1 mg by mouth 2 (two) times daily. Scheduled doses, Disp: , Rfl:  .  cloNIDine (CATAPRES) 0.2 MG tablet, Take 0.2 mg by mouth at bedtime. , Disp: , Rfl:  .  ibuprofen (ADVIL,MOTRIN) 200 MG tablet, Take 200 mg by mouth every 6 (six) hours as needed. fever, Disp: , Rfl:  .  ipratropium (ATROVENT HFA) 17 MCG/ACT inhaler, Inhale 2 puffs into the lungs every 6 (six) hours., Disp: , Rfl:  .  polyvinyl alcohol (LIQUIFILM TEARS) 1.4 % ophthalmic solution, Place 1 drop into both eyes as needed. Dry eyes, Disp: , Rfl:  .  predniSONE (DELTASONE) 10 MG tablet, Take 4 tabs daily x 1 day, 3 tabs daily x 1 day, 2 tabs daily x 1 day, 1 tab daily x  Day then 5 mg x 1 day then  stop, Disp: 11 tablet, Rfl: 0 .  QUEtiapine (SEROQUEL) 25 MG tablet, Take 25 mg by mouth at bedtime., Disp: , Rfl:  .  triamterene-hydrochlorothiazide (DYAZIDE) 37.5-25 MG per capsule, Take 1 capsule by mouth every morning., Disp: , Rfl:  No current facility-administered medications for this visit.  Facility-Administered Medications Ordered in Other Visits:  .  albuterol (PROVENTIL) (2.5 MG/3ML) 0.083% nebulizer solution 2.5 mg, 2.5 mg, Nebulization, Once, Kalman Shan, MD         Objective:   Physical Exam amb wf nad  Wt Readings from Last 3 Encounters:  04/15/15 110 lb 3.2 oz (49.986 kg)  04/08/15 109 lb (49.442 kg)  03/25/15 108 lb 12.8 oz (49.351 kg)    Vital signs reviewed  HEENT: nl dentition, turbinates, and orophanx. Nl external ear canals without cough reflex   NECK :  without JVD/Nodes/TM/ nl carotid upstrokes bilaterally   LUNGS: no acc muscle use, slt distant bs s wheezing    CV:  RRR  no s3 or murmur or increase in  P2, no edema   ABD:  soft and nontender with nl excursion in the supine position. No bruits or organomegaly, bowel sounds nl  MS:  warm without deformities, calf tenderness, cyanosis or clubbing  SKIN: diffuse homogeneous erytematous slt warm rash extending lateral L arm from shoulder to elbow with from both shoulder and elbow, minimal sts  NEURO:  alert, approp, no deficits        Assessment & Plan:

## 2015-04-15 NOTE — Telephone Encounter (Signed)
This needs to be looked at - can she come in &  Be seen by any of the pm docs?

## 2015-04-15 NOTE — Telephone Encounter (Signed)
Patient was given a PNA shot on 7/8, her arm is swollen, itchy, red and hot to touch.  She has been taking Benadryl, Hydrocortisone Cream.   Swelling has increased and moved from her upper arm to her elbow.   Patient wants to know what she should do, this has been progressively getting worse.   RB - please advise.

## 2015-04-15 NOTE — Telephone Encounter (Signed)
Pt reports getting a PNA vaccine at last visit 04/08/15 C/o redness, swelling/puffniess and itchiness on left arm starting at injection site radiating down to elbow.  Pt states that the redness wraps around entire arm and is very uncomfortable.  Has tried Hydrocortisone Cream, oral Benadryl and Ibarest Cream - no relief from anything.  Pt is requesting rec's on what else she can try to reduce the "reaction" to the vaccine.  Please advise Dr Vassie LollAlva as MR is on vacation and is not available.    No Known Allergies

## 2015-04-16 DIAGNOSIS — T50905A Adverse effect of unspecified drugs, medicaments and biological substances, initial encounter: Secondary | ICD-10-CM | POA: Insufficient documentation

## 2015-04-16 NOTE — Assessment & Plan Note (Signed)
L arm p prevnar 04/08/15 localized only > Prednisone 10 mg take  4 each am x 2 days,   2 each am x 2 days,  1 each am x 2 days and stop   No evidence of any systemic effects > rec h1 and H2 antihistamines also   See instructions for specific recommendations which were reviewed directly with the patient who was given a copy with highlighter outlining the key components.

## 2015-05-03 ENCOUNTER — Other Ambulatory Visit: Payer: Self-pay | Admitting: Internal Medicine

## 2015-05-03 DIAGNOSIS — J449 Chronic obstructive pulmonary disease, unspecified: Secondary | ICD-10-CM

## 2015-06-10 ENCOUNTER — Ambulatory Visit (INDEPENDENT_AMBULATORY_CARE_PROVIDER_SITE_OTHER): Payer: Medicaid Other | Admitting: Adult Health

## 2015-06-10 ENCOUNTER — Encounter: Payer: Self-pay | Admitting: Adult Health

## 2015-06-10 VITALS — BP 130/88 | HR 92 | Temp 98.1°F | Ht 59.0 in | Wt 109.0 lb

## 2015-06-10 DIAGNOSIS — J449 Chronic obstructive pulmonary disease, unspecified: Secondary | ICD-10-CM

## 2015-06-10 DIAGNOSIS — Z720 Tobacco use: Secondary | ICD-10-CM | POA: Diagnosis not present

## 2015-06-10 DIAGNOSIS — F17200 Nicotine dependence, unspecified, uncomplicated: Secondary | ICD-10-CM

## 2015-06-10 NOTE — Patient Instructions (Signed)
Continue on Symbicort and Spiriva .  Check with your family doctor if you have had your flu shot.  Work on not smoking.  May try Biotene for dry mouth  follow up Dr. Marchelle Gearing in 3 months and As needed

## 2015-06-10 NOTE — Progress Notes (Signed)
   Subjective:    Patient ID: Sonia Bush, female    DOB: November 26, 1960, 54 y.o.   MRN: [id number]  HPI 54 yo female smoker with severe COPD   Tests Pulmonary function tests done 03/31/2015 shows postbronchodilator FEV1 of 0.66 L/29%, ratio of 29, DLCO of 4.57/25%. There is a 22%] letter response  Walking desaturation test today 04/08/2015: 185 feet 3 laps: She did not desaturate  CT scan of the chest 03/30/2015: Diffuse bilateral emphysema especially in the upper lobes. No nodules or cancer personally visualized film image  06/10/2015 Follow up : COPD  Pt returns for 3 month follow up  Remains on Symbicort and Spiriva  Continue to smoke, discussed cessation .  Last CT in June showed COPD changes.  Unsure if has gotten flu shot yet.  Overall doing okay with no flare of cough or wheezing  Has daily dry cough when smoking .  Complains of dry mouth , discussed biotene.  Denies chest pain , orthopnea , edema , hemoptysis.    Review of Systems  Constitutional:   No  weight loss, night sweats,  Fevers, chills, + fatigue, or  lassitude.  HEENT:   No headaches,  Difficulty swallowing,  Tooth/dental problems, or  Sore throat,                No sneezing, itching, ear ache,  +nasal congestion, post nasal drip,   CV:  No chest pain,  Orthopnea, PND, swelling in lower extremities, anasarca, dizziness, palpitations, syncope.   GI  No heartburn, indigestion, abdominal pain, nausea, vomiting, diarrhea, change in bowel habits, loss of appetite, bloody stools.   Resp:.  No chest wall deformity  Skin: no rash or lesions.  GU: no dysuria, change in color of urine, no urgency or frequency.  No flank pain, no hematuria   MS:  No joint pain or swelling.  No decreased range of motion.  No back pain.  Psych:  No change in mood or affect. No depression or anxiety.  No memory loss.          Objective:   Physical Exam  GEN: A/Ox3; pleasant , NAD, appears older than stated age.    HEENT:  [place]/AT,  EACs-clear, TMs-wnl, NOSE-clear, THROAT-clear, no lesions, no postnasal drip or exudate noted.   NECK:  Supple w/ fair ROM; no JVD; normal carotid impulses w/o bruits; no thyromegaly or nodules palpated; no lymphadenopathy.  RESP  Decreased BS in bases .no accessory muscle use, no dullness to percussion  CARD:  RRR, no m/r/g  , no peripheral edema, pulses intact, no cyanosis or clubbing.  GI:   Soft & nt; nml bowel sounds; no organomegaly or masses detected.  Musco: Warm bil, no deformities or joint swelling noted.   Neuro: alert, no focal deficits noted.    Skin: Warm, no lesions or rashes        Assessment & Plan:

## 2015-06-10 NOTE — Assessment & Plan Note (Signed)
Compensated   Plan Continue on Symbicort and Spiriva .  Check with your family doctor if you have had your flu shot.  Work on not smoking.  May try Biotene for dry mouth  follow up Dr. Marchelle Gearing in 3 months and As needed

## 2015-06-10 NOTE — Assessment & Plan Note (Signed)
Cessation discussed 

## 2015-06-17 ENCOUNTER — Telehealth (HOSPITAL_COMMUNITY): Payer: Self-pay

## 2015-06-17 NOTE — Telephone Encounter (Signed)
Called patient to discuss Pulmonary Rehab.  Patient is on Medicaid which does not cover any of our program.  Patient was offered our maintenance program which is $68-$100 a month.  Patient states that she is not able to afford that at this time due to trying to get her trailer fixed because she said it is a "mold pit". Patient was encouraged to call us in the future if she insurance/financial situation changes.

## 2015-08-02 ENCOUNTER — Ambulatory Visit (INDEPENDENT_AMBULATORY_CARE_PROVIDER_SITE_OTHER): Payer: Medicaid Other | Admitting: Obstetrics

## 2015-08-02 ENCOUNTER — Other Ambulatory Visit: Payer: Self-pay | Admitting: Obstetrics

## 2015-08-02 ENCOUNTER — Encounter: Payer: Self-pay | Admitting: Obstetrics

## 2015-08-02 VITALS — BP 145/104 | HR 93 | Temp 97.5°F | Ht 59.5 in | Wt 112.0 lb

## 2015-08-02 DIAGNOSIS — Z01419 Encounter for gynecological examination (general) (routine) without abnormal findings: Secondary | ICD-10-CM | POA: Diagnosis not present

## 2015-08-02 DIAGNOSIS — E2839 Other primary ovarian failure: Secondary | ICD-10-CM

## 2015-08-02 DIAGNOSIS — Z1239 Encounter for other screening for malignant neoplasm of breast: Secondary | ICD-10-CM

## 2015-08-02 DIAGNOSIS — R399 Unspecified symptoms and signs involving the genitourinary system: Secondary | ICD-10-CM

## 2015-08-02 DIAGNOSIS — Z0000 Encounter for general adult medical examination without abnormal findings: Secondary | ICD-10-CM | POA: Diagnosis not present

## 2015-08-02 DIAGNOSIS — Z1231 Encounter for screening mammogram for malignant neoplasm of breast: Secondary | ICD-10-CM

## 2015-08-02 LAB — POCT URINALYSIS DIPSTICK
BILIRUBIN UA: NEGATIVE
Blood, UA: 50
Glucose, UA: NEGATIVE
Ketones, UA: NEGATIVE
Nitrite, UA: POSITIVE
Protein, UA: NEGATIVE
Spec Grav, UA: 1.01
Urobilinogen, UA: NEGATIVE
pH, UA: 5

## 2015-08-02 MED ORDER — CEFUROXIME AXETIL 500 MG PO TABS: 500.0000 mg | ORAL_TABLET | Freq: Two times a day (BID) | ORAL | Status: DC

## 2015-08-02 NOTE — Progress Notes (Signed)
Subjective:        Sonia Bush is a 54 y.o. female here for a routine exam.  Current complaints: Frequency and burning with urination.    Personal health questionnaire:  Is patient Ashkenazi Jewish, have a family history of breast and/or ovarian cancer: no Is there a family history of uterine cancer diagnosed at age < 55, gastrointestinal cancer, urinary tract cancer, family member who is a Personnel officer syndrome-associated carrier: no Is the patient overweight and hypertensive, family history of diabetes, personal history of gestational diabetes, preeclampsia or PCOS: no Is patient over 35, have PCOS,  family history of premature CHD under age 24, diabetes, smoke, have hypertension or peripheral artery disease:  no At any time, has a partner hit, kicked or otherwise hurt or frightened you?: no Over the past 2 weeks, have you felt down, depressed or hopeless?: no Over the past 2 weeks, have you felt little interest or pleasure in doing things?:no   Gynecologic History No LMP recorded. Patient is postmenopausal. Contraception: post menopausal status Last Pap: unknown. Results were: normal Last mammogram: unknown. Results were: normal  Obstetric History OB History  No data available    Past Medical History  Diagnosis Date  . COPD (chronic obstructive pulmonary disease) (HCC)   . Hypertension   . Anxiety   . Chronic pain   . Asthmatic bronchitis     Past Surgical History  Procedure Laterality Date  . Ectopic pregnancy surgery    . Tubal ligation       Current outpatient prescriptions:  .  albuterol (PROVENTIL HFA;VENTOLIN HFA) 108 (90 BASE) MCG/ACT inhaler, Inhale 2 puffs into the lungs every 6 (six) hours as needed., Disp: , Rfl:  .  budesonide-formoterol (SYMBICORT) 80-4.5 MCG/ACT inhaler, Inhale 2 puffs into the lungs 2 (two) times daily., Disp: 1 Inhaler, Rfl: 12 .  clonazePAM (KLONOPIN) 1 MG tablet, Take 1 mg by mouth 2 (two) times daily., Disp: , Rfl:  .   cloNIDine (CATAPRES) 0.2 MG tablet, Take 0.2 mg by mouth at bedtime. , Disp: , Rfl:  .  HYDROcodone-acetaminophen (NORCO/VICODIN) 5-325 MG tablet, Take 1 tablet by mouth every 6 (six) hours as needed for moderate pain., Disp: , Rfl:  .  Multiple Vitamins-Minerals (MULTI FOR HER 50+ PO), Take 1 tablet by mouth daily., Disp: , Rfl:  .  Omega-3 Fatty Acids (FISH OIL) 1000 MG CAPS, Take 1 capsule by mouth daily., Disp: , Rfl:  .  Probiotic Product (PROBIOTIC DAILY PO), Take 1 tablet by mouth daily., Disp: , Rfl:  .  tiotropium (SPIRIVA HANDIHALER) 18 MCG inhalation capsule, Place 1 capsule (18 mcg total) into inhaler and inhale daily., Disp: 30 capsule, Rfl: 12 .  triamterene-hydrochlorothiazide (DYAZIDE) 37.5-25 MG per capsule, Take 1 capsule by mouth every morning., Disp: , Rfl:  No current facility-administered medications for this visit.  Facility-Administered Medications Ordered in Other Visits:  .  albuterol (PROVENTIL) (2.5 MG/3ML) 0.083% nebulizer solution 2.5 mg, 2.5 mg, Nebulization, Once, Kalman Shan, MD No Known Allergies  Social History  Substance Use Topics  . Smoking status: Current Every Day Smoker -- 0.50 packs/day for 39 years    Types: Cigarettes  . Smokeless tobacco: Never Used  . Alcohol Use: 8.4 oz/week    14 Cans of beer per week    Family History  Problem Relation Age of Onset  . Asthma Daughter   . Asthma Daughter   . Skin cancer Father   . Alzheimer's disease Father  Review of Systems  Constitutional: negative for fatigue and weight loss Respiratory: negative for cough and wheezing Cardiovascular: negative for chest pain, fatigue and palpitations Gastrointestinal: negative for abdominal pain and change in bowel habits Musculoskeletal:negative for myalgias Neurological: negative for gait problems and tremors Behavioral/Psych: negative for abusive relationship, depression Endocrine: negative for temperature intolerance   Genitourinary:negative for  abnormal menstrual periods, genital lesions, hot flashes, sexual problems and vaginal discharge Integument/breast: negative for breast lump, breast tenderness, nipple discharge and skin lesion(s)    Objective:       BP 145/104 mmHg  Pulse 93  Temp(Src) 97.5 F (36.4 C)  Ht 4' 11.5" (1.511 m)  Wt 112 lb (50.803 kg)  BMI 22.25 kg/m2 General:   alert  Skin:   no rash or abnormalities  Lungs:   clear to auscultation bilaterally  Heart:   regular rate and rhythm, S1, S2 normal, no murmur, click, rub or gallop  Breasts:   normal without suspicious masses, skin or nipple changes or axillary nodes  Abdomen:  normal findings: no organomegaly, soft, non-tender and no hernia  Pelvis:  External genitalia: normal general appearance Urinary system: urethral meatus normal and bladder without fullness, nontender Vaginal: normal without tenderness, induration or masses Cervix: normal appearance Adnexa: normal bimanual exam Uterus: anteverted and non-tender, normal size   Lab Review Urine pregnancy test Labs reviewed yes Radiologic studies reviewed yes    Assessment:    Healthy female exam.  Postmenopause.  Doing well.  UTI symptoms.     Plan:   Ceftin Rx Bone Density Study ordered   Education reviewed: calcium supplements, self breast exams and weight bearing exercise. Mammogram ordered. Follow up in: 1 year.   Meds ordered this encounter  Medications  . HYDROcodone-acetaminophen (NORCO/VICODIN) 5-325 MG tablet    Sig: Take 1 tablet by mouth every 6 (six) hours as needed for moderate pain.   Orders Placed This Encounter  Procedures  . Urine culture  . POCT urinalysis dipstick

## 2015-08-04 LAB — PAP, TP IMAGING W/ HPV RNA, RFLX HPV TYPE 16,18/45: HPV mRNA, High Risk: NOT DETECTED

## 2015-08-05 ENCOUNTER — Other Ambulatory Visit: Payer: Self-pay | Admitting: Obstetrics

## 2015-08-05 LAB — URINE CULTURE: Colony Count: >=100000

## 2015-08-08 LAB — SURESWAB, VAGINOSIS/VAGINITIS PLUS
ATOPOBIUM VAGINAE: NOT DETECTED Log (cells/mL)
C. TRACHOMATIS RNA, TMA: NOT DETECTED
C. albicans, DNA: NOT DETECTED
C. glabrata, DNA: NOT DETECTED
C. parapsilosis, DNA: NOT DETECTED
C. tropicalis, DNA: NOT DETECTED
Gardnerella vaginalis: NOT DETECTED Log (cells/mL)
LACTOBACILLUS SPECIES: NOT DETECTED Log (cells/mL)
MEGASPHAERA SPECIES: NOT DETECTED Log (cells/mL)
N. gonorrhoeae RNA, TMA: NOT DETECTED
T. VAGINALIS RNA, QL TMA: NOT DETECTED

## 2015-09-07 ENCOUNTER — Ambulatory Visit
Admission: RE | Admit: 2015-09-07 | Discharge: 2015-09-07 | Disposition: A | Discharge status: Home / Self Care | Payer: Medicaid Other | Source: Ambulatory Visit | Attending: Obstetrics | Admitting: Obstetrics

## 2015-09-07 ENCOUNTER — Other Ambulatory Visit: Payer: Self-pay | Admitting: Obstetrics

## 2015-09-07 DIAGNOSIS — Z1239 Encounter for other screening for malignant neoplasm of breast: Secondary | ICD-10-CM

## 2015-09-07 DIAGNOSIS — E2839 Other primary ovarian failure: Secondary | ICD-10-CM

## 2015-09-07 DIAGNOSIS — Z1231 Encounter for screening mammogram for malignant neoplasm of breast: Secondary | ICD-10-CM

## 2015-09-07 DIAGNOSIS — N6489 Other specified disorders of breast: Secondary | ICD-10-CM

## 2015-09-13 ENCOUNTER — Ambulatory Visit
Admission: RE | Admit: 2015-09-13 | Discharge: 2015-09-13 | Disposition: A | Discharge status: Home / Self Care | Payer: Medicaid Other | Source: Ambulatory Visit | Attending: Obstetrics | Admitting: Obstetrics

## 2015-09-13 DIAGNOSIS — N6489 Other specified disorders of breast: Secondary | ICD-10-CM

## 2015-10-13 ENCOUNTER — Ambulatory Visit: Payer: Medicaid Other | Admitting: Internal Medicine

## 2015-10-14 ENCOUNTER — Ambulatory Visit (INDEPENDENT_AMBULATORY_CARE_PROVIDER_SITE_OTHER): Payer: Medicaid Other | Admitting: Internal Medicine

## 2015-10-14 ENCOUNTER — Encounter: Payer: Self-pay | Admitting: Internal Medicine

## 2015-10-14 VITALS — BP 112/78 | HR 118 | Temp 100.7°F | Ht 59.5 in | Wt 113.6 lb

## 2015-10-14 DIAGNOSIS — J441 Chronic obstructive pulmonary disease with (acute) exacerbation: Secondary | ICD-10-CM

## 2015-10-14 DIAGNOSIS — B370 Candidal stomatitis: Secondary | ICD-10-CM | POA: Diagnosis not present

## 2015-10-14 MED ORDER — PREDNISONE 10 MG PO TABS: ORAL_TABLET | ORAL | Status: DC

## 2015-10-14 MED ORDER — LEVOFLOXACIN 500 MG PO TABS: 500.0000 mg | ORAL_TABLET | Freq: Every day | ORAL | Status: DC

## 2015-10-14 MED ORDER — NYSTATIN 100000 UNIT/ML MT SUSP: 5.0000 mL | Freq: Four times a day (QID) | OROMUCOSAL | Status: DC

## 2015-10-14 NOTE — Addendum Note (Signed)
Addended by: Marvene StaffOX, SHEENA H on: 10/14/2015 12:27 PM   Modules accepted: Orders

## 2015-10-14 NOTE — Patient Instructions (Signed)
ICD-9-CM ICD-10-CM   1. COPD exacerbation (HCC) 491.21 J44.1   2. Oral thrush 112.0 B37.0    #AECOPD  take levaquin 500mg  once daily  X 5 days Take prednisone 40 mg daily x 2 days, then 20mg  daily x 2 days, then 10mg  daily x 2 days, then 5mg  daily x 2 days and stop Continue Spiriva and Symbicort; nurse will ensure refills   # Oral thrush - For Oral thrush: Take Suspension (swish and swallow): 500,000 units 4 times/day for 5 days; swish in the mouth and retain for as long as possible (several minutes) before swallowing   #Follow-up - Go to the emergency room if you not feeling better or getting worse -Routine follow-up in 3 months or sooner if needed

## 2015-10-14 NOTE — Progress Notes (Signed)
Subjective:     Patient ID: Sonia Bush, female   DOB: 1960-10-09, 55 y.o.   MRN: [id number]  HPI    OV 10/14/2015  Chief Complaint  Patient presents with  . Follow-up    Pt c/o increased SOB, body aches, low grade fever, nausea with loss of appetite and cough with yellow mucus.    55 year old female with Gold stage III/IV  COPD on triple inhaler therapy. Last 1 week she's been having low-grade fever with chest tightness sinus congestion and increased shortness of breath. She feels similar to summer 2016 when she was not on triple inhaler therapy. She feels overall that she has been doing well on triple inhaler therapy until current acute symptoms. She reports compliance with Spiriva and Symbicort although it sometimes at night after taking Symbicort she does not rinse her mouth.  Past medical history  - The last 2 months she did get a flu shot. She says her primary care physician the administered this in the triceps area and after that she did have some neuropathy symptoms going down her left forearm and left arm when she carried stuff. This is resolved.  Current outpatient prescriptions:  .  acetaminophen (TYLENOL) 500 MG tablet, Take 500 mg by mouth every 6 (six) hours as needed., Disp: , Rfl:  .  albuterol (PROVENTIL HFA;VENTOLIN HFA) 108 (90 BASE) MCG/ACT inhaler, Inhale 2 puffs into the lungs every 6 (six) hours as needed., Disp: , Rfl:  .  antiseptic oral rinse (BIOTENE) LIQD, 15 mLs by Mouth Rinse route as needed for dry mouth., Disp: , Rfl:  .  budesonide-formoterol (SYMBICORT) 80-4.5 MCG/ACT inhaler, Inhale 2 puffs into the lungs 2 (two) times daily., Disp: 1 Inhaler, Rfl: 12 .  clonazePAM (KLONOPIN) 1 MG tablet, Take 1 mg by mouth 2 (two) times daily., Disp: , Rfl:  .  cloNIDine (CATAPRES) 0.2 MG tablet, Take 0.2 mg by mouth at bedtime. , Disp: , Rfl:  .  guaiFENesin-dextromethorphan (ROBITUSSIN DM) 100-10 MG/5ML syrup, Take 5 mLs by mouth every 4 (four) hours as needed for  cough., Disp: , Rfl:  .  HYDROcodone-acetaminophen (NORCO/VICODIN) 5-325 MG tablet, Take 1 tablet by mouth every 6 (six) hours as needed for moderate pain., Disp: , Rfl:  .  ibuprofen (ADVIL,MOTRIN) 100 MG tablet, Take 100 mg by mouth every 6 (six) hours as needed for fever., Disp: , Rfl:  .  Multiple Vitamins-Minerals (MULTI FOR HER 50+ PO), Take 1 tablet by mouth daily., Disp: , Rfl:  .  Omega-3 Fatty Acids (FISH OIL) 1000 MG CAPS, Take 1 capsule by mouth daily., Disp: , Rfl:  .  Probiotic Product (PROBIOTIC DAILY PO), Take 1 tablet by mouth daily., Disp: , Rfl:  .  tiotropium (SPIRIVA HANDIHALER) 18 MCG inhalation capsule, Place 1 capsule (18 mcg total) into inhaler and inhale daily., Disp: 30 capsule, Rfl: 12 .  triamterene-hydrochlorothiazide (DYAZIDE) 37.5-25 MG per capsule, Take 1 capsule by mouth every morning., Disp: , Rfl:  No current facility-administered medications for this visit.  Facility-Administered Medications Ordered in Other Visits:  .  albuterol (PROVENTIL) (2.5 MG/3ML) 0.083% nebulizer solution 2.5 mg, 2.5 mg, Nebulization, Once, Kalman ShanMurali Ryan Palermo, MD   Immunization History  Administered Date(s) Administered  . Influenza Split 08/01/2014, 07/27/2015  . Pneumococcal Conjugate-13 04/08/2015       No Known Allergies    Review of Systems         Objective:   Physical Exam  Constitutional: She is oriented to person, place, and time.  She appears well-developed and well-nourished. No distress.  HENT:  Head: Normocephalic and atraumatic.  Right Ear: External ear normal.  Left Ear: External ear normal.  Mouth/Throat: Oropharynx is clear and moist. No oropharyngeal exudate.  Upper dentures present Oral thrush present  Eyes: Conjunctivae and EOM are normal. Pupils are equal, round, and reactive to light. Right eye exhibits no discharge. Left eye exhibits no discharge. No scleral icterus.  Neck: Normal range of motion. Neck supple. No JVD present. No tracheal  deviation present. No thyromegaly present.  Cardiovascular: Normal rate, regular rhythm, normal heart sounds and intact distal pulses.  Exam reveals no gallop and no friction rub.   No murmur heard. Pulmonary/Chest: Effort normal. No respiratory distress. She has wheezes. She has no rales. She exhibits no tenderness.  Abdominal: Soft. Bowel sounds are normal. She exhibits no distension and no mass. There is no tenderness. There is no rebound and no guarding.  Musculoskeletal: Normal range of motion. She exhibits no edema or tenderness.  Lymphadenopathy:    She has no cervical adenopathy.  Neurological: She is alert and oriented to person, place, and time. She has normal reflexes. No cranial nerve deficit. She exhibits normal muscle tone. Coordination normal.  Skin: Skin is warm and dry. No rash noted. She is not diaphoretic. No erythema. No pallor.  Psychiatric: She has a normal mood and affect. Her behavior is normal. Judgment and thought content normal.  Vitals reviewed.   Filed Vitals:   10/14/15 1151  BP: 112/78  Pulse: 118  Temp: 100.7 F (38.2 C)  TempSrc: Oral  Height: 4' 11.5" (1.511 m)  Weight: 113 lb 9.6 oz (51.529 kg)  SpO2: 93%        Assessment:       ICD-9-CM ICD-10-CM   1. COPD exacerbation (HCC) 491.21 J44.1   2. Oral thrush 112.0 B37.0        Plan:     * #AECOPD  take levaquin 500mg  once daily  X 5 days Take prednisone 40 mg daily x 2 days, then 20mg  daily x 2 days, then 10mg  daily x 2 days, then 5mg  daily x 2 days and stop Continue Spiriva and Symbicort; nurse will ensure refills   # Oral thrush - For Oral thrush: Take Suspension (swish and swallow): 500,000 units 4 times/day for 5 days; swish in the mouth and retain for as long as possible (several minutes) before swallowing   #Follow-up - Go to the emergency room if you not feeling better or getting worse -Routine follow-up in 3 months or sooner if needed  Dr. Kalman Shan, M.D.,  [facility].C.P Pulmonary and Critical Care Medicine Staff Physician [place] [facility] [place] Pulmonary and Critical Care Pager: [phone], If no answer or between  15:00h - 7:00h: call [phone]  10/14/2015 12:23 PM

## 2016-01-17 ENCOUNTER — Encounter: Payer: Self-pay | Admitting: Internal Medicine

## 2016-01-17 ENCOUNTER — Ambulatory Visit (INDEPENDENT_AMBULATORY_CARE_PROVIDER_SITE_OTHER): Payer: Medicaid Other | Admitting: Internal Medicine

## 2016-01-17 ENCOUNTER — Other Ambulatory Visit (INDEPENDENT_AMBULATORY_CARE_PROVIDER_SITE_OTHER): Payer: Medicaid Other

## 2016-01-17 VITALS — BP 142/84 | HR 102 | Ht 59.5 in | Wt 110.6 lb

## 2016-01-17 DIAGNOSIS — F17200 Nicotine dependence, unspecified, uncomplicated: Secondary | ICD-10-CM

## 2016-01-17 DIAGNOSIS — Z720 Tobacco use: Secondary | ICD-10-CM

## 2016-01-17 DIAGNOSIS — R252 Cramp and spasm: Secondary | ICD-10-CM

## 2016-01-17 DIAGNOSIS — Z87891 Personal history of nicotine dependence: Secondary | ICD-10-CM | POA: Diagnosis not present

## 2016-01-17 DIAGNOSIS — J449 Chronic obstructive pulmonary disease, unspecified: Secondary | ICD-10-CM | POA: Diagnosis not present

## 2016-01-17 LAB — BASIC METABOLIC PANEL
BUN: 23 mg/dL (ref 6–23)
CHLORIDE: 95 meq/L — AB (ref 96–112)
CO2: 34 meq/L — AB (ref 19–32)
Calcium: 10.4 mg/dL (ref 8.4–10.5)
Creatinine, Ser: 0.83 mg/dL (ref 0.40–1.20)
GFR: 75.52 mL/min (ref >60.00)
Glucose, Bld: 156 mg/dL — ABNORMAL HIGH (ref 70–99)
POTASSIUM: 3.5 meq/L (ref 3.5–5.1)
SODIUM: 136 meq/L (ref 135–145)

## 2016-01-17 LAB — MAGNESIUM: Magnesium: 1.8 mg/dL (ref 1.5–2.5)

## 2016-01-17 MED ORDER — FLUTICASONE FUROATE-VILANTEROL 100-25 MCG/INH IN AEPB: 1.0000 | INHALATION_SPRAY | Freq: Every day | RESPIRATORY_TRACT | Status: DC

## 2016-01-17 NOTE — Progress Notes (Signed)
Subjective:     Patient ID: Sonia Bush, female   DOB: Mar 24, 1961, 55 y.o.   MRN: [id number]  HPI  OV 01/17/2016  Chief Complaint  Patient presents with  . Follow-up    Pt states her breathing is at baseline. Pt states she feels the symbicort makes her wheeze and then feels she needs to use her albuterol hfa.    Follow-up Gold stage IV COPD-not on home oxygen but on triple inhaler therapy: She had an exacerbation July 2016 around the time I started her on triple inhaler therapy. That was treated with prednisone. Then in January 2017 at the time of last visit had another exacerbation treated with anabiotic's and prednisone. She tells me that several weeks after that she saw primary care physician and had to get repeat antibiotics or prednisone of which she is unsure. She currently feels stable. COPD cat score is 26 and reflects a stable baseline COPD which is improved compared to a year ago. Nevertheless she tells me that Symbicort makes a wheeze  Smoking: Continues to smoke. Struggles to quit  New issue is leg cramps: Ever since she started inhalers she's having leg cramps. Self treated with over-the-counter potassium which has helped. When she does not take potassium leg cramps get worse. She wants me to check her chemistry levels.  Lung cancer screening: She is eligible for this but she will need a referral to the cancer screening program.     CAT COPD Symptom & Quality of Life Score (GSK trademark) 0 is no burden. 5 is highest burden 01/17/2016   Never Cough -> Cough all the time 3  No phlegm in chest -> Chest is full of phlegm 3  No chest tightness -> Chest feels very tight 2  No dyspnea for 1 flight stairs/hill -> Very dyspneic for 1 flight of stairs 4  No limitations for ADL at home -> Very limited with ADL at home 4  Confident leaving home -> Not at all confident leaving home 3  Sleep soundly -> Do not sleep soundly because of lung condition 3  Lots of Energy -> No energy  at all 4  TOTAL Score (max 40)  26        has a past medical history of COPD (chronic obstructive pulmonary disease) (HCC); Hypertension; Anxiety; Chronic pain; and Asthmatic bronchitis.   reports that she has been smoking Cigarettes.  She has a 19.5 pack-year smoking history. She has never used smokeless tobacco.  Past Surgical History  Procedure Laterality Date  . Ectopic pregnancy surgery    . Tubal ligation      No Known Allergies  Immunization History  Administered Date(s) Administered  . Influenza Split 08/01/2014, 07/27/2015  . Pneumococcal Conjugate-13 04/08/2015    Family History  Problem Relation Age of Onset  . Asthma Daughter   . Asthma Daughter   . Skin cancer Father   . Alzheimer's disease Father      Current outpatient prescriptions:  .  acetaminophen (TYLENOL) 500 MG tablet, Take 500 mg by mouth every 6 (six) hours as needed., Disp: , Rfl:  .  albuterol (PROVENTIL HFA;VENTOLIN HFA) 108 (90 BASE) MCG/ACT inhaler, Inhale 2 puffs into the lungs every 6 (six) hours as needed., Disp: , Rfl:  .  antiseptic oral rinse (BIOTENE) LIQD, 15 mLs by Mouth Rinse route as needed for dry mouth., Disp: , Rfl:  .  budesonide-formoterol (SYMBICORT) 80-4.5 MCG/ACT inhaler, Inhale 2 puffs into the lungs 2 (two) times daily., Disp:  1 Inhaler, Rfl: 12 .  clonazePAM (KLONOPIN) 1 MG tablet, Take 1 mg by mouth 2 (two) times daily., Disp: , Rfl:  .  cloNIDine (CATAPRES) 0.2 MG tablet, Take 0.2 mg by mouth at bedtime. , Disp: , Rfl:  .  guaiFENesin-dextromethorphan (ROBITUSSIN DM) 100-10 MG/5ML syrup, Take 5 mLs by mouth every 4 (four) hours as needed for cough., Disp: , Rfl:  .  HYDROcodone-acetaminophen (NORCO/VICODIN) 5-325 MG tablet, Take 1 tablet by mouth every 6 (six) hours as needed for moderate pain., Disp: , Rfl:  .  ibuprofen (ADVIL,MOTRIN) 100 MG tablet, Take 100 mg by mouth every 6 (six) hours as needed for fever., Disp: , Rfl:  .  Multiple Vitamins-Minerals (MULTI FOR HER  50+ PO), Take 1 tablet by mouth daily., Disp: , Rfl:  .  Omega-3 Fatty Acids (FISH OIL) 1000 MG CAPS, Take 1 capsule by mouth daily., Disp: , Rfl:  .  Probiotic Product (PROBIOTIC DAILY PO), Take 1 tablet by mouth daily., Disp: , Rfl:  .  tiotropium (SPIRIVA HANDIHALER) 18 MCG inhalation capsule, Place 1 capsule (18 mcg total) into inhaler and inhale daily., Disp: 30 capsule, Rfl: 12 .  triamterene-hydrochlorothiazide (DYAZIDE) 37.5-25 MG per capsule, Take 1 capsule by mouth every morning., Disp: , Rfl:  .  valsartan (DIOVAN) 80 MG tablet, Take 80 mg by mouth daily., Disp: , Rfl:  No current facility-administered medications for this visit.  Facility-Administered Medications Ordered in Other Visits:  .  albuterol (PROVENTIL) (2.5 MG/3ML) 0.083% nebulizer solution 2.5 mg, 2.5 mg, Nebulization, Once, Kalman Shan, MD    Review of Systems     Objective:   Physical Exam  Constitutional: She is oriented to person, place, and time. She appears well-developed and well-nourished. No distress.  HENT:  Head: Normocephalic and atraumatic.  Right Ear: External ear normal.  Left Ear: External ear normal.  Mouth/Throat: Oropharynx is clear and moist. No oropharyngeal exudate.  Eyes: Conjunctivae and EOM are normal. Pupils are equal, round, and reactive to light. Right eye exhibits no discharge. Left eye exhibits no discharge. No scleral icterus.  Neck: Normal range of motion. Neck supple. No JVD present. No tracheal deviation present. No thyromegaly present.  Cardiovascular: Normal rate, regular rhythm, normal heart sounds and intact distal pulses.  Exam reveals no gallop and no friction rub.   No murmur heard. Pulmonary/Chest: Effort normal and breath sounds normal. No respiratory distress. She has no wheezes. She has no rales. She exhibits no tenderness.  Abdominal: Soft. Bowel sounds are normal. She exhibits no distension and no mass. There is no tenderness. There is no rebound and no guarding.   Musculoskeletal: Normal range of motion. She exhibits no edema or tenderness.  Lymphadenopathy:    She has no cervical adenopathy.  Neurological: She is alert and oriented to person, place, and time. She has normal reflexes. No cranial nerve deficit. She exhibits normal muscle tone. Coordination normal.  Skin: Skin is warm and dry. No rash noted. She is not diaphoretic. No erythema. No pallor.  Psychiatric: She has a normal mood and affect. Her behavior is normal. Judgment and thought content normal.  Vitals reviewed.   Filed Vitals:   01/17/16 1522  BP: 142/84  Pulse: 102  Height: 4' 11.5" (1.511 m)  Weight: 110 lb 9.6 oz (50.168 kg)  SpO2: 96%        Assessment:       ICD-9-CM ICD-10-CM   1. 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 Smoking 305.1 Z72.0  3. Cramp of both lower extremities 729.82 R25.2        Plan:      #COPD  - stable disease  - continue spiriva -0 change symbicort to low dose breo due to wheezing with symbicort - albuterol as needed  - check alpha 1 - will call with resuls - consider research protocols - if interested let u sknow  #smoking  - need to quit asap; if you need help we can discuss at followup  #leg cramps  - check bmet and mag  #Cancer screening  - refer to Kandice RobinsonsSarah GRoce for cancer screening of lung  #Followup  6 months or sooner if needed     Dr. Kalman ShanMurali Sanyia Dini, M.D., [facility]C.P Pulmonary and Critical Care Medicine Staff Physician  [facility] [place] Pulmonary and Critical Care Pager: [phone], If no answer or between  15:00h - 7:00h: call [phone]  01/17/2016 3:57 PM

## 2016-01-17 NOTE — Patient Instructions (Addendum)
ICD-9-CM ICD-10-CM   1. 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 Smoking 305.1 Z72.0   3. Cramp of both lower extremities 729.82 R25.2    #COPD  - stable disease  - continue spiriva -0 change symbicort to low dose breo due to wheezing with symbicort - albuterol as needed  - check alpha 1 - will call with resuls - consider research protocols - if interested let u sknow  #smoking  - need to quit asap; if you need help we can discuss at followup  #leg cramps  - check bmet and mag  #Cancer screening  - refer to Kandice RobinsonsSarah GRoce for cancer screening of lung  #Followup  6 months or sooner if needed

## 2016-01-18 NOTE — Progress Notes (Signed)
Quick Note:  Called and spoke to pt. Informed her of the results and recs per MR. Pt verbalized understanding and denied any further questions or concerns at this time.   ______ 

## 2016-01-23 ENCOUNTER — Telehealth: Payer: Self-pay | Admitting: Internal Medicine

## 2016-01-23 LAB — ALPHA-1 ANTITRYPSIN PHENOTYPE: A-1 Antitrypsin: 118 mg/dL (ref 83–199)

## 2016-01-23 NOTE — Telephone Encounter (Signed)
Attempted to contact pt. No answer, no option to leave a message. Will try back.  

## 2016-01-24 NOTE — Telephone Encounter (Signed)
Spoke with pt, states she needs a PA for breo.   Called [facility] on [address], faxing forms to initiate PA.   Awaiting fax

## 2016-01-24 NOTE — Progress Notes (Signed)
Quick Note:  Called and spoke to pt. Informed her of the results per MR. Pt verbalized understanding and denied any further questions or concerns at this time.   ______ 

## 2016-01-25 NOTE — Telephone Encounter (Signed)
Unsure if fax was ever received - not in MR's lookat Called [facility] on [address] and spoke with DoniphanAshley >> [phone], ID # [id number] O  Called [facility] and spoke with Toniann FailWendy to attempt PA for Breo over the phone - PA has been sent to pharmacy review.  We can call to check the status after 24hrs. Reference # [id number] (that includes the letter "I") PA # [id number] Toniann FailWendy did include the covered alternatives: QVAR, Dulera, Advair, Pulmicort Respules

## 2016-01-26 ENCOUNTER — Telehealth: Payer: Self-pay | Admitting: Acute Care

## 2016-01-26 NOTE — Telephone Encounter (Signed)
Ok I will address at followup

## 2016-01-26 NOTE — Telephone Encounter (Signed)
Called spoke with pt. Due to her insurance coverage she does not qualify for the lung cancer screening program per protocol. I informed her that I would make MR aware. She voiced understanding and had no further questions. Message has been sent to MR to make him aware.

## 2016-01-30 MED ORDER — MOMETASONE FURO-FORMOTEROL FUM 200-5 MCG/ACT IN AERO: 2.0000 | INHALATION_SPRAY | Freq: Two times a day (BID) | RESPIRATORY_TRACT | Status: DC

## 2016-01-30 NOTE — Telephone Encounter (Signed)
Spoke with pt to relay below recs.  She has already tried and failed Symbicort.  Rx of Elwin SleightDulera was sent to pharmacy.  Pt expressed understanding of med change.  Nothing further needed.

## 2016-01-30 NOTE — Telephone Encounter (Signed)
Ok send one of those that she has not tried  Dr. Kalman ShanMurali Bee Hammerschmidt, M.D., [facility].C.P Pulmonary and Critical Care Medicine Staff Physician [place] [facility]  Pulmonary and Critical Care Pager: [phone], If no answer or between  15:00h - 7:00h: call [phone]  01/30/2016 12:56 PM

## 2016-01-30 NOTE — Telephone Encounter (Signed)
Spoke with Francis DowseJoel at [facility], states that PA is denied- patient must try and fail 2 covered alternatives: Advair discus, Dulera, and Symbicort.   Reference # for call: [id number]  MR please advise on alternative medication.  Thanks.

## 2016-04-04 ENCOUNTER — Telehealth: Payer: Self-pay | Admitting: Internal Medicine

## 2016-04-04 MED ORDER — FLUCONAZOLE 100 MG PO TABS: 100.0000 mg | ORAL_TABLET | Freq: Every day | ORAL | Status: DC

## 2016-04-04 MED ORDER — FLUTICASONE FUROATE-VILANTEROL 100-25 MCG/INH IN AEPB: 1.0000 | INHALATION_SPRAY | Freq: Every day | RESPIRATORY_TRACT | Status: DC

## 2016-04-04 NOTE — Telephone Encounter (Signed)
Spoke with pt and she states that she does not think she is diabetic. She is ok [facility], but states that this was not approved by her ins in the past. Advised that we will send in and then try to complete a PA if necessary. Reviewed pt's oral hygiene routine and advised pt to clean toothbrush and dentures with peroxide or alcohol to try and kill thrush fungus. Pt also asked about eating cottage cheese and drinking beer. Advised pt that she should stop these while taking fluconazole and waiting on thrush to clear. Pt states she "doesn't know if she can live without her 3-4 beers daily" and I advised pt that if she wants the thrush to clear up she will need to abstain from alcohol. Pt voiced understanding. Rx's sent. Nothing further needed.

## 2016-04-04 NOTE — Telephone Encounter (Signed)
Very unusual but possible. Please ask her if she thinks she could be diabetic. Ok [facility] but ensure rinse mouth. If thrush keeps recurring with it to let us know and we can some blood work to see if there is any immune problem    For current thrush  fluconazole 200 mg loading dose, followed by 100 mg daily for 7days

## 2016-04-04 NOTE — Telephone Encounter (Signed)
Spoke with pt and she states that she has thrush again. Pt states that she has had thrush at least 3 times in the past several months. She has taken both liquid and tablets. Most recently treated by her PCP. She finished that medication and within 2 days of stopping the medicine the thrush has returned. She states it is in the roof of her mouth, tongue and throat. Pt [facility] She would like to know what she needs to do since it is not clearing up.  MR Please advise. Thanks!

## 2016-04-24 ENCOUNTER — Telehealth: Payer: Self-pay | Admitting: Internal Medicine

## 2016-04-24 NOTE — Telephone Encounter (Signed)
pls call pt on her cell. [phone]

## 2016-04-24 NOTE — Telephone Encounter (Signed)
Called spoke with pt. She c/o SOB and fatigue. She states that she feels that the [place] are the cause of these symptoms. She states that she has had to use her albuterol more frequently since starting Breo and would like to discuss an alternative with MR. I explained to her that  I would send a message to MR and would return her call once we received his recs. She voiced understanding and had no further questions.  MR please advise

## 2016-04-25 MED ORDER — PREDNISONE 10 MG PO TABS: ORAL_TABLET | ORAL | 0 refills | Status: DC

## 2016-04-25 MED ORDER — UMECLIDINIUM BROMIDE 62.5 MCG/INH IN AEPB: 1.0000 | INHALATION_SPRAY | Freq: Every day | RESPIRATORY_TRACT | 0 refills | Status: DC

## 2016-04-25 NOTE — Telephone Encounter (Signed)
Patient notified of Dr. Jane Canary recommendations.  Rx sent to pharmacy. Sample of Incruse and Symbicort at front for patient to pick up. Patient is aware. Nothing further needed.

## 2016-04-25 NOTE — Telephone Encounter (Signed)
I think heat might be causing some mild aecopd but is possible that meds can be flaring up  Plan = change spiriva to incruse -= change breo back to symbicort 80/4.5 2 puff bid; lower dose  - Please take prednisone 40 mg x1 day, then 30 mg x1 day, then 20 mg x1 day, then 10 mg x1 day, and then 5 mg x1 day and stop  - ER or come to see [place] sooner if above not working or worse  Dr. Kalman Shan, M.D., [facility].C.P Pulmonary and Critical Care Medicine Staff Physician [facility] [employer] Pulmonary and Critical Care Pager: [phone], If no answer or between  15:00h - 7:00h: call [phone]  04/25/2016 4:44 PM

## 2016-04-26 ENCOUNTER — Telehealth: Payer: Self-pay | Admitting: Internal Medicine

## 2016-04-26 MED ORDER — UMECLIDINIUM BROMIDE 62.5 MCG/INH IN AEPB: 1.0000 | INHALATION_SPRAY | Freq: Every day | RESPIRATORY_TRACT | 6 refills | Status: DC

## 2016-04-26 MED ORDER — BUDESONIDE-FORMOTEROL FUMARATE 80-4.5 MCG/ACT IN AERO: 2.0000 | INHALATION_SPRAY | Freq: Two times a day (BID) | RESPIRATORY_TRACT | 6 refills | Status: DC

## 2016-04-26 NOTE — Telephone Encounter (Signed)
Pt needing to clarify instructions for use of the Incruse and Symbicort 80/4.64mcg Per MR : Plan = change spiriva to incruse -= change breo back to symbicort 80/4.5 2 puff bid; lower dose  - Please take prednisone 40 mg x1 day, then 30 mg x1 day, then 20 mg x1 day, then 10 mg x1 day, and then 5 mg x1 day and stop  Pt aware of instructions of use and will let us know if there is anything further needed once she starts the meds.  Pt requests that Rx's be sent to her pharmacy for both the Incruse and Symbicort.  Nothing further needed.

## 2016-07-24 ENCOUNTER — Ambulatory Visit (INDEPENDENT_AMBULATORY_CARE_PROVIDER_SITE_OTHER): Payer: Medicaid Other | Admitting: Internal Medicine

## 2016-07-24 ENCOUNTER — Encounter: Payer: Self-pay | Admitting: Internal Medicine

## 2016-07-24 VITALS — BP 108/78 | HR 86 | Ht 59.5 in | Wt 110.0 lb

## 2016-07-24 DIAGNOSIS — F17200 Nicotine dependence, unspecified, uncomplicated: Secondary | ICD-10-CM

## 2016-07-24 DIAGNOSIS — J449 Chronic obstructive pulmonary disease, unspecified: Secondary | ICD-10-CM

## 2016-07-24 DIAGNOSIS — Z23 Encounter for immunization: Secondary | ICD-10-CM | POA: Diagnosis not present

## 2016-07-24 NOTE — Patient Instructions (Addendum)
ICD-9-CM ICD-10-CM   1. 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 Smoking 305.1 F17.200     #COPD  - stable disease  - continue INCRUSE - continue symbicort - continue albuterol as needed - albuterol as needed - flu shot 07/24/2016   #smoking  - need to quit asap; if you need help we can discuss at followup  #Cancer screening  - refer to Kandice RobinsonsSarah GRoce for cancer screening of lung  #Followup - in 6 months do Pre-bd spiro and dlco only. No lung volume or bd response. No post-bd spiro - return to see me in 6 months orr sooner if needed

## 2016-07-24 NOTE — Progress Notes (Signed)
Subjective:     Patient ID: Sonia Bush, female   DOB: [birth year], 55 y.o.   MRN: [id number]  HPI     OV 01/17/2016  Chief Complaint  Patient presents with  . Follow-up    Pt states her breathing is at baseline. Pt states she feels the symbicort makes her wheeze and then feels she needs to use her albuterol hfa.    Follow-up Gold stage IV COPD-not on home oxygen but on triple inhaler therapy: She had an exacerbation July 2016 around the time I started her on triple inhaler therapy. That was treated with prednisone. Then in January 2017 at the time of last visit had another exacerbation treated with anabiotic's and prednisone. She tells me that several weeks after that she saw primary care physician and had to get repeat antibiotics or prednisone of which she is unsure. She currently feels stable. COPD cat score is 26 and reflects a stable baseline COPD which is improved compared to a year ago. Nevertheless she tells me that Symbicort makes a wheeze  Smoking: Continues to smoke. Struggles to quit  New issue is leg cramps: Ever since she started inhalers she's having leg cramps. Self treated with over-the-counter potassium which has helped. When she does not take potassium leg cramps get worse. She wants me to check her chemistry levels.  Lung cancer screening: She is eligible for this but she will need a referral to the cancer screening program.       OV 07/24/2016  Chief Complaint  Patient presents with  . Follow-up    Pt states her SOB is unchanged since last OV. Pt c/o prod cough with white mucus - at baseline. Pt denies CP/tightness and f/c/s.       Follow-up Gold stage IV COPD. Not on home oxygen but will inhaler therapy. Since last visit she continues to smoke. She is unable to pulmonary rehabilitation because of Medicaidlack of coverage. Her triple inhaler therapy is now Incruse and symbicort. She felt Spiriva was causing of thrush. She does not want her to [facility] or  Brio because these were ineffective according to subjective opinion. Current triple inhaler therapy works well for her. She will have a flu shot. There are no new issues.    CAT COPD Symptom & Quality of Life Score (GSK trademark) 0 is no burden. 5 is highest burden 01/17/2016   Never Cough -> Cough all the time 3  No phlegm in chest -> Chest is full of phlegm 3  No chest tightness -> Chest feels very tight 2  No dyspnea for 1 flight stairs/hill -> Very dyspneic for 1 flight of stairs 4  No limitations for ADL at home -> Very limited with ADL at home 4  Confident leaving home -> Not at all confident leaving home 3  Sleep soundly -> Do not sleep soundly because of lung condition 3  Lots of Energy -> No energy at all 4  TOTAL Score (max 40)  26     has a past medical history of Anxiety; Asthmatic bronchitis; Chronic pain; COPD (chronic obstructive pulmonary disease) (HCC); and Hypertension.   reports that she has been smoking Cigarettes.  She has a 19.50 pack-year smoking history. She has never used smokeless tobacco.  Past Surgical History:  Procedure Laterality Date  . ECTOPIC PREGNANCY SURGERY    . TUBAL LIGATION      No Known Allergies  Immunization History  Administered Date(s) Administered  . Influenza Split 08/01/2014, 07/27/2015  .  Pneumococcal Conjugate-13 04/08/2015    Family History  Problem Relation Age of Onset  . Asthma Daughter   . Asthma Daughter   . Skin cancer Father   . Alzheimer's disease Father      Current Outpatient Prescriptions:  .  acetaminophen (TYLENOL) 500 MG tablet, Take 500 mg by mouth every 6 (six) hours as needed., Disp: , Rfl:  .  albuterol (PROVENTIL HFA;VENTOLIN HFA) 108 (90 BASE) MCG/ACT inhaler, Inhale 2 puffs into the lungs every 6 (six) hours as needed., Disp: , Rfl:  .  antiseptic oral rinse (BIOTENE) LIQD, 15 mLs by Mouth Rinse route as needed for dry mouth., Disp: , Rfl:  .  budesonide-formoterol (SYMBICORT) 80-4.5 MCG/ACT  inhaler, Inhale 2 puffs into the lungs 2 (two) times daily., Disp: 1 Inhaler, Rfl: 6 .  clonazePAM (KLONOPIN) 1 MG tablet, Take 1 mg by mouth 2 (two) times daily., Disp: , Rfl:  .  cloNIDine (CATAPRES) 0.2 MG tablet, Take 0.2 mg by mouth at bedtime. , Disp: , Rfl:  .  guaiFENesin-dextromethorphan (ROBITUSSIN DM) 100-10 MG/5ML syrup, Take 5 mLs by mouth every 4 (four) hours as needed for cough., Disp: , Rfl:  .  HYDROcodone-acetaminophen (NORCO/VICODIN) 5-325 MG tablet, Take 1 tablet by mouth every 6 (six) hours as needed for moderate pain., Disp: , Rfl:  .  ibuprofen (ADVIL,MOTRIN) 100 MG tablet, Take 100 mg by mouth every 6 (six) hours as needed for fever., Disp: , Rfl:  .  MAGNESIUM PO, Take by mouth daily., Disp: , Rfl:  .  Multiple Vitamins-Minerals (MULTI FOR HER 50+ PO), Take 1 tablet by mouth daily., Disp: , Rfl:  .  Omega-3 Fatty Acids (FISH OIL) 1000 MG CAPS, Take 1 capsule by mouth daily., Disp: , Rfl:  .  Potassium (POTASSIMIN PO), Take by mouth., Disp: , Rfl:  .  Probiotic Product (PROBIOTIC DAILY PO), Take 1 tablet by mouth daily., Disp: , Rfl:  .  triamterene-hydrochlorothiazide (DYAZIDE) 37.5-25 MG per capsule, Take 1 capsule by mouth every morning., Disp: , Rfl:  .  umeclidinium bromide (INCRUSE ELLIPTA) 62.5 MCG/INH AEPB, Inhale 1 puff into the lungs daily., Disp: 1 each, Rfl: 6 .  valsartan (DIOVAN) 80 MG tablet, Take 80 mg by mouth daily., Disp: , Rfl:  No current facility-administered medications for this visit.   Facility-Administered Medications Ordered in Other Visits:  .  albuterol (PROVENTIL) (2.5 MG/3ML) 0.083% nebulizer solution 2.5 mg, 2.5 mg, Nebulization, Once, Kalman ShanMurali Momoka Stringfield, MD    Review of Systems     Objective:   Physical Exam  Constitutional: She is oriented to person, place, and time. She appears well-developed and well-nourished. No distress.  HENT:  Head: Normocephalic and atraumatic.  Right Ear: External ear normal.  Left Ear: External ear  normal.  Mouth/Throat: Oropharynx is clear and moist. No oropharyngeal exudate.  Eyes: Conjunctivae and EOM are normal. Pupils are equal, round, and reactive to light. Right eye exhibits no discharge. Left eye exhibits no discharge. No scleral icterus.  Neck: Normal range of motion. Neck supple. No JVD present. No tracheal deviation present. No thyromegaly present.  Cardiovascular: Normal rate, regular rhythm, normal heart sounds and intact distal pulses.  Exam reveals no gallop and no friction rub.   No murmur heard. Pulmonary/Chest: Effort normal and breath sounds normal. No respiratory distress. She has no wheezes. She has no rales. She exhibits no tenderness.  Abdominal: Soft. Bowel sounds are normal. She exhibits no distension and no mass. There is no tenderness. There is no  rebound and no guarding.  Musculoskeletal: Normal range of motion. She exhibits no edema or tenderness.  Lymphadenopathy:    She has no cervical adenopathy.  Neurological: She is alert and oriented to person, place, and time. She has normal reflexes. No cranial nerve deficit. She exhibits normal muscle tone. Coordination normal.  Skin: Skin is warm and dry. No rash noted. She is not diaphoretic. No erythema. No pallor.  Psychiatric: She has a normal mood and affect. Her behavior is normal. Judgment and thought content normal.  Vitals reviewed.   Vitals:   07/24/16 1157  BP: 108/78  Pulse: 86   Vitals:   07/24/16 1157  BP: 108/78  Pulse: 86  SpO2: 94%  Weight: 110 lb (49.9 kg)  Height: 4' 11.5" (1.511 m)        Assessment:       ICD-9-CM ICD-10-CM   1. 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 Smoking 305.1 F17.200        Plan:      #COPD  - stable disease  - continue INCRUSE - continue symbicort - continue albuterol as needed - albuterol as needed - flu shot 07/24/2016    #smoking  - need to quit asap; if you need help we can discuss at followup  #Cancer screening  - refer to Kandice Robinsons for cancer screening of lung  #Followup - in 6 months do Pre-bd spiro and dlco only. No lung volume or bd response. No post-bd spiro - return to see me in 6 months or sooner if needed   Dr. Kalman Shan, M.D., [facility].C.P Pulmonary and Critical Care Medicine Staff Physician [place] [facility] [place] Pulmonary and Critical Care Pager: [phone], If no answer or between  15:00h - 7:00h: call [phone]  07/24/2016 12:18 PM

## 2016-10-08 ENCOUNTER — Other Ambulatory Visit: Payer: Self-pay | Admitting: Obstetrics

## 2016-10-08 DIAGNOSIS — N3000 Acute cystitis without hematuria: Secondary | ICD-10-CM

## 2016-10-08 MED ORDER — NITROFURANTOIN MONOHYD MACRO 100 MG PO CAPS: 100.0000 mg | ORAL_CAPSULE | Freq: Two times a day (BID) | ORAL | 2 refills | Status: DC

## 2016-10-08 NOTE — Progress Notes (Signed)
Macrobid Rx for UTI 

## 2016-10-09 ENCOUNTER — Telehealth: Payer: Self-pay

## 2016-10-09 NOTE — Telephone Encounter (Signed)
-----   Message from Brock Badharles A Harper, MD sent at 10/08/2016  9:17 AM EST ----- Macrobid Rx

## 2016-11-01 ENCOUNTER — Ambulatory Visit (INDEPENDENT_AMBULATORY_CARE_PROVIDER_SITE_OTHER)
Admission: RE | Admit: 2016-11-01 | Discharge: 2016-11-01 | Disposition: A | Discharge status: Home / Self Care | Payer: Medicaid Other | Source: Ambulatory Visit | Attending: Internal Medicine | Admitting: Internal Medicine

## 2016-11-01 ENCOUNTER — Encounter (INDEPENDENT_AMBULATORY_CARE_PROVIDER_SITE_OTHER): Payer: Self-pay

## 2016-11-01 ENCOUNTER — Ambulatory Visit (INDEPENDENT_AMBULATORY_CARE_PROVIDER_SITE_OTHER): Payer: Medicare Other | Admitting: Internal Medicine

## 2016-11-01 ENCOUNTER — Encounter: Payer: Self-pay | Admitting: Internal Medicine

## 2016-11-01 ENCOUNTER — Telehealth: Payer: Self-pay | Admitting: Internal Medicine

## 2016-11-01 VITALS — BP 110/78 | HR 112 | Temp 98.4°F | Ht 59.5 in | Wt 112.0 lb

## 2016-11-01 DIAGNOSIS — J441 Chronic obstructive pulmonary disease with (acute) exacerbation: Secondary | ICD-10-CM | POA: Diagnosis not present

## 2016-11-01 DIAGNOSIS — J9601 Acute respiratory failure with hypoxia: Secondary | ICD-10-CM

## 2016-11-01 DIAGNOSIS — J449 Chronic obstructive pulmonary disease, unspecified: Secondary | ICD-10-CM

## 2016-11-01 MED ORDER — PREDNISONE 10 MG PO TABS: ORAL_TABLET | ORAL | 0 refills | Status: DC

## 2016-11-01 MED ORDER — METHYLPREDNISOLONE ACETATE 80 MG/ML IJ SUSP
80.0000 mg | Freq: Once | INTRAMUSCULAR | Status: AC
  Administered 2016-11-01: 80 mg via INTRAMUSCULAR

## 2016-11-01 NOTE — Telephone Encounter (Signed)
Pt aware of results and voiced her understanding. Nothing further needed.  

## 2016-11-01 NOTE — Progress Notes (Signed)
Subjective:     Patient ID: Sonia Bush, female   DOB: 1961-02-18, 56 y.o.   MRN: [id number]  HPI  OV 01/17/2016  Chief Complaint  Patient presents with  . Follow-up    Pt states her breathing is at baseline. Pt states she feels the symbicort makes her wheeze and then feels she needs to use her albuterol hfa.    Follow-up Gold stage IV COPD-not on home oxygen but on triple inhaler therapy: She had an exacerbation July 2016 around the time I started her on triple inhaler therapy. That was treated with prednisone. Then in January 2017 at the time of last visit had another exacerbation treated with anabiotic's and prednisone. She tells me that several weeks after that she saw primary care physician and had to get repeat antibiotics or prednisone of which she is unsure. She currently feels stable. COPD cat score is 26 and reflects a stable baseline COPD which is improved compared to a year ago. Nevertheless she tells me that Symbicort makes a wheeze  Smoking: Continues to smoke. Struggles to quit  New issue is leg cramps: Ever since she started inhalers she's having leg cramps. Self treated with over-the-counter potassium which has helped. When she does not take potassium leg cramps get worse. She wants me to check her chemistry levels.  Lung cancer screening: She is eligible for this but she will need a referral to the cancer screening program.       OV 07/24/2016  Chief Complaint  Patient presents with  . Follow-up    Pt states her SOB is unchanged since last OV. Pt c/o prod cough with white mucus - at baseline. Pt denies CP/tightness and f/c/s.       Follow-up Gold stage IV COPD. Not on home oxygen but on triple  inhaler therapy. Since last visit she continues to smoke. She is unable to pulmonary rehabilitation because of Medicaidlack of coverage. Her triple inhaler therapy is now Incruse and symbicort. She felt Spiriva was causing of thrush. She does not want her to [facility] or  Brio because these were ineffective according to subjective opinion. Current triple inhaler therapy works well for her. She will have a flu shot. There are no new issues.    OV 11/01/2016  Chief Complaint  Patient presents with  . Acute Visit    Pt c/o increase in SOB an prod cough with clear mucus and body aches, fever. Pt states she was given doxy. Pt states she has not taken her temp but she has had chills.    Acute visit for Gold stage IV COPD patient was not on oxygen therapy but on triple inhaler therapy and has continued to smoke.  Approximately one half weeks ago she developed a head cold. She saw her primary care physician and was given one half days of amoxicillin but this did not help. Subsequently on 10/25/2016 because of unimproved symptoms of cough headache or wheezing and subjective feverishness she saw primary care physician again and was prescribed doxycycline. She does not recollect chest x-ray at any time. Of note she is up-to-date with her flu shot. She does not think she has a flu but she does have fatigue. Therefore she decided to come in acutely today and upon arrival was hypoxemic on room air requiring. As of oxygen. This is a new finding for her. She is able to talk sentences but does not desire admission to the hospital. This no edema or hemoptysis or syncope   CAT  COPD Symptom & Quality of Life Score (GSK trademark) 0 is no burden. 5 is highest burden 01/17/2016   Never Cough -> Cough all the time 3  No phlegm in chest -> Chest is full of phlegm 3  No chest tightness -> Chest feels very tight 2  No dyspnea for 1 flight stairs/hill -> Very dyspneic for 1 flight of stairs 4  No limitations for ADL at home -> Very limited with ADL at home 4  Confident leaving home -> Not at all confident leaving home 3  Sleep soundly -> Do not sleep soundly because of lung condition 3  Lots of Energy -> No energy at all 4  TOTAL Score (max 40)  26        has a past medical history  of Anxiety; Asthmatic bronchitis; Chronic pain; COPD (chronic obstructive pulmonary disease) (HCC); and Hypertension.   reports that she has been smoking Cigarettes.  She has a 19.50 pack-year smoking history. She has never used smokeless tobacco.  Past Surgical History:  Procedure Laterality Date  . ECTOPIC PREGNANCY SURGERY    . TUBAL LIGATION      No Known Allergies  Immunization History  Administered Date(s) Administered  . Influenza Split 08/01/2014, 07/27/2015  . Influenza,inj,Quad PF,36+ Mos 07/24/2016  . Pneumococcal Conjugate-13 04/08/2015    Family History  Problem Relation Age of Onset  . Asthma Daughter   . Asthma Daughter   . Skin cancer Father   . Alzheimer's disease Father      Current Outpatient Prescriptions:  .  acetaminophen (TYLENOL) 500 MG tablet, Take 500 mg by mouth every 6 (six) hours as needed., Disp: , Rfl:  .  albuterol (PROVENTIL HFA;VENTOLIN HFA) 108 (90 BASE) MCG/ACT inhaler, Inhale 2 puffs into the lungs every 6 (six) hours as needed., Disp: , Rfl:  .  antiseptic oral rinse (BIOTENE) LIQD, 15 mLs by Mouth Rinse route as needed for dry mouth., Disp: , Rfl:  .  budesonide-formoterol (SYMBICORT) 80-4.5 MCG/ACT inhaler, Inhale 2 puffs into the lungs 2 (two) times daily., Disp: 1 Inhaler, Rfl: 6 .  clonazePAM (KLONOPIN) 1 MG tablet, Take 1 mg by mouth 2 (two) times daily., Disp: , Rfl:  .  cloNIDine (CATAPRES) 0.2 MG tablet, Take 0.2 mg by mouth at bedtime. , Disp: , Rfl:  .  doxycycline (VIBRA-TABS) 100 MG tablet, Take 100 mg by mouth 2 (two) times daily., Disp: , Rfl:  .  guaiFENesin-dextromethorphan (ROBITUSSIN DM) 100-10 MG/5ML syrup, Take 5 mLs by mouth every 4 (four) hours as needed for cough., Disp: , Rfl:  .  HYDROcodone-acetaminophen (NORCO/VICODIN) 5-325 MG tablet, Take 1 tablet by mouth every 6 (six) hours as needed for moderate pain., Disp: , Rfl:  .  ibuprofen (ADVIL,MOTRIN) 100 MG tablet, Take 100 mg by mouth every 6 (six) hours as needed  for fever., Disp: , Rfl:  .  MAGNESIUM PO, Take by mouth daily., Disp: , Rfl:  .  Multiple Vitamins-Minerals (MULTI FOR HER 50+ PO), Take 1 tablet by mouth daily., Disp: , Rfl:  .  Omega-3 Fatty Acids (FISH OIL) 1000 MG CAPS, Take 1 capsule by mouth daily., Disp: , Rfl:  .  Potassium (POTASSIMIN PO), Take by mouth., Disp: , Rfl:  .  Probiotic Product (PROBIOTIC DAILY PO), Take 1 tablet by mouth daily., Disp: , Rfl:  .  triamterene-hydrochlorothiazide (DYAZIDE) 37.5-25 MG per capsule, Take 1 capsule by mouth every morning., Disp: , Rfl:  .  umeclidinium bromide (INCRUSE ELLIPTA) 62.5 MCG/INH AEPB,  Inhale 1 puff into the lungs daily., Disp: 1 each, Rfl: 6 .  valsartan (DIOVAN) 80 MG tablet, Take 80 mg by mouth daily., Disp: , Rfl:  No current facility-administered medications for this visit.   Facility-Administered Medications Ordered in Other Visits:  .  albuterol (PROVENTIL) (2.5 MG/3ML) 0.083% nebulizer solution 2.5 mg, 2.5 mg, Nebulization, Once, Kalman Shan, MD    Review of Systems     Objective:   Physical Exam  Constitutional: She is oriented to person, place, and time. She appears well-developed and well-nourished. No distress.  Does not look in distress and actually looks baseline  HENT:  Head: Normocephalic and atraumatic.  Right Ear: External ear normal.  Left Ear: External ear normal.  Mouth/Throat: Oropharynx is clear and moist. No oropharyngeal exudate.  Able to talk. Sentences Oxygen on Mild peripheral cyanosis present  Eyes: Conjunctivae and EOM are normal. Pupils are equal, round, and reactive to light. Right eye exhibits no discharge. Left eye exhibits no discharge. No scleral icterus.  Neck: Normal range of motion. Neck supple. No JVD present. No tracheal deviation present. No thyromegaly present.  Cardiovascular: Normal rate, regular rhythm, normal heart sounds and intact distal pulses.  Exam reveals no gallop and no friction rub.   No murmur  heard. Pulmonary/Chest: Effort normal. No respiratory distress. She has wheezes. She has no rales. She exhibits no tenderness.  Faint scattered wheezes  Abdominal: Soft. Bowel sounds are normal. She exhibits no distension and no mass. There is no tenderness. There is no rebound and no guarding.  Musculoskeletal: Normal range of motion. She exhibits no edema or tenderness.  Lymphadenopathy:    She has no cervical adenopathy.  Neurological: She is alert and oriented to person, place, and time. She has normal reflexes. No cranial nerve deficit. She exhibits normal muscle tone. Coordination normal.  Skin: Skin is warm and dry. No rash noted. She is not diaphoretic. No erythema. No pallor.  Psychiatric: She has a normal mood and affect. Her behavior is normal. Judgment and thought content normal.  Vitals reviewed.   Vitals:   11/01/16 1240  BP: 110/78  Pulse: (!) 112  Temp: 98.4 F (36.9 C)  TempSrc: Oral  SpO2: (!) 84%  Weight: 112 lb (50.8 kg)  Height: 4' 11.5" (1.511 m)    .     Assessment:       ICD-9-CM ICD-10-CM   1. Acute respiratory failure with hypoxia (HCC) 518.81 J96.01   2. 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 COPD exacerbation (HCC) 491.21 J44.1     - She even though she looks well she is actually hypoxemic. She needs admission to the hospital but she does not want to get admitted. She does not mind home treatment but she says she'll have a low threshold for admission if she gets worse. Working diagnosis is COPD exacerbatio    Plan:     Doubt flu clinically - offered empiric tamiflu - she declined Significant copd exacerbation and deterioration - now hypoxemic  Plan  - cxr 2 view in office 11/01/2016   -IM depot medrol 80mg  in office 11/01/2016 -r ecommend admission but respect desire for home treatment  - go to ER if worse - start 3LNC o2 24/7 11/01/2016   - chagne doxycycline to levaquin 500mg  daily x 5 days - Take prednisone 40 mg daily x 2 days, then 20mg   daily x 2 days, then 10mg  daily x 2 days, then 5mg  daily x 2 days and stop -  continue incrruse and symbicort  -to ER if worse/not better  Followup - wil call with cxr results - return in 5 days for followup with nurse practitionre  Dr. Kalman Shan, M.D., [facility].C.P Pulmonary and Critical Care Medicine Staff Physician [facility] [place] Pulmonary and Critical Care Pager: [phone], If no answer or between  15:00h - 7:00h: call [phone]  11/01/2016 1:03 PM

## 2016-11-01 NOTE — Patient Instructions (Signed)
ICD-9-CM ICD-10-CM   1. Acute respiratory failure with hypoxia (HCC) 518.81 J96.01   2. 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 COPD exacerbation (HCC) 491.21 J44.1     Doubt flu clinically Significant copd exacerbation and deterioration - now hypoxemic  Plan  - cxr 2 view in office 11/01/2016   -IM depot medrol 80mg  in office 11/01/2016 -r ecommend admission but respect desire for home treatment  - go to ER if worse - start 3LNC o2 24/7 11/01/2016   - chagne doxycycline to levaquin 500mg  daily x 5 days - Take prednisone 40 mg daily x 2 days, then 20mg  daily x 2 days, then 10mg  daily x 2 days, then 5mg  daily x 2 days and stop - continue incrruse and symbicort  -to ER if worse/not better  Followup - wil call with cxr results - return in 5 days for followup with nurse practitionre

## 2016-11-01 NOTE — Telephone Encounter (Signed)
   cxr clear  If she is worse go to er.   Dr. Kalman ShanMurali Jarek Longton, M.D., [facility].C.P Pulmonary and Critical Care Medicine Staff Physician [facility] [place] Pulmonary and Critical Care Pager: [phone], If no answer or between  15:00h - 7:00h: call [phone]  11/01/2016 3:05 PM

## 2016-11-05 ENCOUNTER — Telehealth: Payer: Self-pay | Admitting: Internal Medicine

## 2016-11-05 NOTE — Telephone Encounter (Signed)
Message from Kandice RobinsonsSarah Groce that she is not eligible for lung cancer screening CT due to medicaid. Will address at next ov  Dr. Kalman ShanMurali Kjersti Dittmer, M.D., [facility] Pulmonary and Critical Care Medicine Staff Physician [facility] [place] [facility] Pager: [phone], If no answer or between  15:00h - 7:00h: call [phone]  11/05/2016 8:34 AM

## 2016-11-06 ENCOUNTER — Ambulatory Visit (INDEPENDENT_AMBULATORY_CARE_PROVIDER_SITE_OTHER): Payer: Medicare Other | Admitting: Adult Health

## 2016-11-06 ENCOUNTER — Encounter: Payer: Self-pay | Admitting: Adult Health

## 2016-11-06 VITALS — BP 106/66 | Ht 59.5 in | Wt 108.8 lb

## 2016-11-06 DIAGNOSIS — J441 Chronic obstructive pulmonary disease with (acute) exacerbation: Secondary | ICD-10-CM | POA: Diagnosis not present

## 2016-11-06 DIAGNOSIS — J9611 Chronic respiratory failure with hypoxia: Secondary | ICD-10-CM | POA: Diagnosis not present

## 2016-11-06 DIAGNOSIS — J449 Chronic obstructive pulmonary disease, unspecified: Secondary | ICD-10-CM | POA: Diagnosis not present

## 2016-11-06 DIAGNOSIS — J9610 Chronic respiratory failure, unspecified whether with hypoxia or hypercapnia: Secondary | ICD-10-CM | POA: Insufficient documentation

## 2016-11-06 MED ORDER — LEVOFLOXACIN 500 MG PO TABS
500.0000 mg | ORAL_TABLET | Freq: Every day | ORAL | 0 refills | Status: AC
Start: 2016-11-06 — End: 2016-11-16

## 2016-11-06 MED ORDER — PREDNISONE 10 MG PO TABS: 10.0000 mg | ORAL_TABLET | Freq: Every day | ORAL | 0 refills | Status: DC

## 2016-11-06 NOTE — Progress Notes (Signed)
@Patient  ID: Sonia Bush, female    DOB: 23-May-1961, 56 y.o.   MRN: [id number]  Chief Complaint  Patient presents with  . Follow-up    COPD     Referring provider: Aida Puffer, MD  HPI: 56 yo female smoker followed for severe COPD   TEST  Pulmonary function tests done 03/31/2015 shows postbronchodilator FEV1 of 0.66 L/29%, ratio of 29, DLCO of 4.57/25%. There is a 22%] letter response  Walking desaturation test today 04/08/2015: 185 feet 3 laps: She did not desaturate  CT scan of the chest 03/30/2015: Diffuse bilateral emphysema especially in the upper lobes. No nodules or cancer personally visualized film image  11/06/2016 Follow up : COPD  Patient presents for a 5 day follow-up. Patient was recently seen in the office for COPD exacerbation. She was recommended for hospitalization. However, patient declined. She is also recommended to start on oxygen and she also declined this. O2 saturations last visit were down into the mid 80s on room air. Patient was instructed to begin Levaquin and prednisone. Patient did start on her prednisone taper and is currently on 10 mg. Patient unfortunately did not get her Levaquin as the pharmacy did not have this prescription.Marland Kitchen She was on doxycycline, however, stopped this. Patient says she is feeling some better but continues to have cough and wheezing. She does get short of breath with walking around. Today in office. O2 saturations 87% walking and 94% with 2 L of O2. She has agreed to begin oxygen. Patient says she has continued to cough up thick yellow mucus. She denies any fever or hemoptysis. Chest x-ray last visit did not show any pneumonia with chronic COPD changes   No Known Allergies  Immunization History  Administered Date(s) Administered  . Influenza Split 08/01/2014, 07/27/2015  . Influenza,inj,Quad PF,36+ Mos 07/24/2016  . Pneumococcal Conjugate-13 04/08/2015    Past Medical History:  Diagnosis Date  . Anxiety   .  Asthmatic bronchitis   . Chronic pain   . COPD (chronic obstructive pulmonary disease) (HCC)   . Hypertension     Tobacco History: History  Smoking Status  . Current Every Day Smoker  . Packs/day: 0.50  . Years: 39.00  . Types: Cigarettes  Smokeless Tobacco  . Never Used    Comment: 10 cigs per day 10.24.17   Ready to quit: No Counseling given: Yes   Outpatient Encounter Prescriptions as of 11/06/2016  Medication Sig  . acetaminophen (TYLENOL) 500 MG tablet Take 500 mg by mouth every 6 (six) hours as needed.  Marland Kitchen albuterol (PROVENTIL HFA;VENTOLIN HFA) 108 (90 BASE) MCG/ACT inhaler Inhale 2 puffs into the lungs every 6 (six) hours as needed.  Marland Kitchen antiseptic oral rinse (BIOTENE) LIQD 15 mLs by Mouth Rinse route as needed for dry mouth.  . budesonide-formoterol (SYMBICORT) 80-4.5 MCG/ACT inhaler Inhale 2 puffs into the lungs 2 (two) times daily.  . clonazePAM (KLONOPIN) 1 MG tablet Take 1 mg by mouth 2 (two) times daily.  . cloNIDine (CATAPRES) 0.2 MG tablet Take 0.2 mg by mouth at bedtime.   Marland Kitchen doxycycline (VIBRA-TABS) 100 MG tablet Take 100 mg by mouth 2 (two) times daily.  Marland Kitchen guaiFENesin-dextromethorphan (ROBITUSSIN DM) 100-10 MG/5ML syrup Take 5 mLs by mouth every 4 (four) hours as needed for cough.  Marland Kitchen HYDROcodone-acetaminophen (NORCO/VICODIN) 5-325 MG tablet Take 1 tablet by mouth every 6 (six) hours as needed for moderate pain.  Marland Kitchen ibuprofen (ADVIL,MOTRIN) 100 MG tablet Take 100 mg by mouth every 6 (six) hours as  needed for fever.  Marland Kitchen. MAGNESIUM PO Take by mouth daily.  . Multiple Vitamins-Minerals (MULTI FOR HER 50+ PO) Take 1 tablet by mouth daily.  . Omega-3 Fatty Acids (FISH OIL) 1000 MG CAPS Take 1 capsule by mouth daily.  . Potassium (POTASSIMIN PO) Take by mouth.  . predniSONE (DELTASONE) 10 MG tablet 40 mg daily x 2 days, then 20mg  daily x 2 days, then 10mg  daily x 2 days, then 5mg  daily x 2 days and stop  . Probiotic Product (PROBIOTIC DAILY PO) Take 1 tablet by mouth daily.    Marland Kitchen. triamterene-hydrochlorothiazide (DYAZIDE) 37.5-25 MG per capsule Take 1 capsule by mouth every morning.  . umeclidinium bromide (INCRUSE ELLIPTA) 62.5 MCG/INH AEPB Inhale 1 puff into the lungs daily.  . valsartan (DIOVAN) 80 MG tablet Take 80 mg by mouth daily.  Marland Kitchen. levofloxacin (LEVAQUIN) 500 MG tablet Take 1 tablet (500 mg total) by mouth daily.  . predniSONE (DELTASONE) 10 MG tablet Take 1 tablet (10 mg total) by mouth daily with breakfast.   Facility-Administered Encounter Medications as of 11/06/2016  Medication  . albuterol (PROVENTIL) (2.5 MG/3ML) 0.083% nebulizer solution 2.5 mg     Review of Systems  Constitutional:   No  weight loss, night sweats,  Fevers, chills,  +fatigue, or  lassitude.  HEENT:   No headaches,  Difficulty swallowing,  Tooth/dental problems, or  Sore throat,                No sneezing, itching, ear ache,  +nasal congestion, post nasal drip,   CV:  No chest pain,  Orthopnea, PND, swelling in lower extremities, anasarca, dizziness, palpitations, syncope.   GI  No heartburn, indigestion, abdominal pain, nausea, vomiting, diarrhea, change in bowel habits, loss of appetite, bloody stools.   Resp:  No wheezing.  No chest wall deformity  Skin: no rash or lesions.  GU: no dysuria, change in color of urine, no urgency or frequency.  No flank pain, no hematuria   MS:  No joint pain or swelling.  No decreased range of motion.  No back pain.    Physical Exam  BP 106/66 (BP Location: Left Arm, Cuff Size: Normal)   Ht 4' 11.5" (1.511 m)   Wt 108 lb 12.8 oz (49.4 kg)   BMI 21.61 kg/m   GEN: A/Ox3; pleasant , NAD    HEENT:  [place]/AT,  EACs-clear, TMs-wnl, NOSE-clear, THROAT-clear, no lesions, no postnasal drip or exudate noted.   NECK:  Supple w/ fair ROM; no JVD; normal carotid impulses w/o bruits; no thyromegaly or nodules palpated; no lymphadenopathy.    RESP Few trace wheezes  no accessory muscle use, no dullness to percussion, talking in full sentences    CARD:  RRR, no m/r/g, no peripheral edema, pulses intact, no cyanosis or clubbing.  GI:   Soft & nt; nml bowel sounds; no organomegaly or masses detected.   Musco: Warm bil, no deformities or joint swelling noted.   Neuro: alert, no focal deficits noted.    Skin: Warm, no lesions or rashes  Psych:  No change in mood or affect. No depression or anxiety.  No memory loss.  Lab Results:  CBC    Component Value Date/Time   WBC 14.6 (H) 11/14/2011 0225   RBC 4.21 11/14/2011 0225   HGB 14.7 11/14/2011 0225   HCT 41.6 11/14/2011 0225   PLT 254 11/14/2011 0225   MCV 98.8 11/14/2011 0225   MCH 34.9 (H) 11/14/2011 0225   MCHC 35.3 11/14/2011 0225  RDW 11.6 11/14/2011 0225   LYMPHSABS 1.3 11/14/2011 0225   MONOABS 0.9 11/14/2011 0225   EOSABS 0.2 11/14/2011 0225   BASOSABS 0.0 11/14/2011 0225    BMET    Component Value Date/Time   NA 136 01/17/2016 1605   K 3.5 01/17/2016 1605   CL 95 (L) 01/17/2016 1605   CO2 34 (H) 01/17/2016 1605   GLUCOSE 156 (H) 01/17/2016 1605   BUN 23 01/17/2016 1605   CREATININE 0.83 01/17/2016 1605   CALCIUM 10.4 01/17/2016 1605   GFRNONAA >90 11/14/2011 0225   GFRAA >90 11/14/2011 0225    BNP No results found for: BNP  ProBNP No results found for: PROBNP  Imaging: Dg Chest 2 View  Result Date: 11/01/2016 CLINICAL DATA:  Wheezing, posterior back pain, intermittent fever, and cough for the past 12 days. History of emphysema, previous episodes of pneumonia, and current smoker EXAM: CHEST  2 VIEW COMPARISON:  CT scan of the chest of March 30, 2015 and PA chest x-ray of May 14, 2012. FINDINGS: The lungs remain hyperinflated. The interstitial markings remain mildly increased. There is no alveolar pneumonia nor pleural effusion. The heart and pulmonary vascularity are normal. There is faint calcification in the wall of the aortic arch. The mediastinum is normal in width. There is no pleural effusion. The bony thorax exhibits no acute abnormality.  IMPRESSION: COPD.  No pneumonia nor other acute cardiopulmonary abnormality. Thoracic aortic atherosclerosis. Electronically Signed   By: David  [place] M.D.   On: 11/01/2016 14:39     Assessment & Plan:   COPD exacerbation (HCC) Slow to resolve flare with bronchitis   Plan  Patient Instructions  Begin Levaquin 500mg  daily for 7 days .  Continue on Prednisone 10mg  daily .  Begin Oxygen 2l/m .  Evaluate for POC .  Mucinex DM Twice daily  As needed  Cough/congestion .  Follow up Dr. Marchelle Gearing in 2 weeks and As needed   Please contact office for sooner follow up if symptoms do not improve or worsen or seek emergency care      Chronic respiratory failure (HCC) Chronic Resp Failure - pt has been resistant to starting on O2 but continues to have desats, she agrees to O2 now  And will have it set up from DME at home   Plan  .Begin O2 at 2l/m .      Rubye Oaks, NP 11/06/2016

## 2016-11-06 NOTE — Progress Notes (Signed)
Patient ID: Sonia Bush, female   DOB: August 17, 1961, 56 y.o.   MRN: [id number]  Pt refused oxygen. Pt states she would like to try the Levaquin and prednisone prior to oxygen therapy. Dr. Marchelle Gearingamaswamy is aware. Pt aware to contact the office or seek emergency care if her signs/symptoms were to worsen.

## 2016-11-06 NOTE — Assessment & Plan Note (Signed)
Chronic Resp Failure - pt has been resistant to starting on O2 but continues to have desats, she agrees to O2 now  And will have it set up from DME at home   Plan  .Begin O2 at 2l/m .

## 2016-11-06 NOTE — Assessment & Plan Note (Signed)
Slow to resolve flare with bronchitis   Plan  Patient Instructions  Begin Levaquin 500mg  daily for 7 days .  Continue on Prednisone 10mg  daily .  Begin Oxygen 2l/m .  Evaluate for POC .  Mucinex DM Twice daily  As needed  Cough/congestion .  Follow up Dr. Marchelle Gearingamaswamy in 2 weeks and As needed   Please contact office for sooner follow up if symptoms do not improve or worsen or seek emergency care

## 2016-11-06 NOTE — Patient Instructions (Signed)
Begin Levaquin 500mg  daily for 7 days .  Continue on Prednisone 10mg  daily .  Begin Oxygen 2l/m .  Evaluate for POC .  Mucinex DM Twice daily  As needed  Cough/congestion .  Follow up Dr. Marchelle Gearingamaswamy in 2 weeks and As needed   Please contact office for sooner follow up if symptoms do not improve or worsen or seek emergency care

## 2016-11-09 ENCOUNTER — Telehealth: Payer: Self-pay | Admitting: Adult Health

## 2016-11-09 MED ORDER — NICOTINE 21-14-7 MG/24HR TD KIT: PACK | TRANSDERMAL | 0 refills | Status: DC

## 2016-11-09 NOTE — Telephone Encounter (Signed)
Step down pack sent to pharmacy.  Pt aware.  Nothing further needed.

## 2016-11-09 NOTE — Telephone Encounter (Signed)
Spoke with pt. States at her last OV, TP wanted her to start on nicotine patches. She would like to have a prescription for these instead of getting them over the counter.  TP - please advise what mg dosage you want the pt to start at. Thanks.

## 2016-11-09 NOTE — Telephone Encounter (Signed)
That is fine , can send Nicoderm CQ step down pack  follow up up as planned and As needed

## 2016-11-12 ENCOUNTER — Telehealth: Payer: Self-pay | Admitting: Adult Health

## 2016-11-12 MED ORDER — CETIRIZINE HCL 10 MG PO TABS: 10.0000 mg | ORAL_TABLET | Freq: Every day | ORAL | 0 refills | Status: DC

## 2016-11-12 MED ORDER — FAMOTIDINE 10 MG PO TABS: 10.0000 mg | ORAL_TABLET | Freq: Every day | ORAL | 0 refills | Status: DC

## 2016-11-12 NOTE — Telephone Encounter (Signed)
Spoke with pt. And made her aware of TP's message. Pt. Understood and had no further questions. She requested we try and send rx to her pharmacy to see if her insurance will pay for it due to her having some financial problems right now. The rxs was sent in. Nothing further is needed at this time.

## 2016-11-12 NOTE — Telephone Encounter (Signed)
Spoke with pt. States that she is having reaction to one of her medications. At her last OV she was given Levaquin and told to continue Prednisone 10mg  daily. She has 1 day left of Levaquin. Reports having lots of itching on her legs. Also states that Prednisone is causing her face to flush and she looks like she "has a sunburn." Pt asked if she could take Benadryl to help with the itching and I advised her that she could.   TP - please advise. Thanks.

## 2016-11-12 NOTE — Telephone Encounter (Signed)
Would stop levaquin , can start Zyrtec 10mg  and Pepcid 10mg  At bedtime  X 3 days , Can use Benadrly is needed.  If not improving or worsens will need ov  Please contact office for sooner follow up if symptoms do not improve or worsen or seek emergency care

## 2016-11-14 ENCOUNTER — Telehealth: Payer: Self-pay | Admitting: Adult Health

## 2016-11-14 NOTE — Telephone Encounter (Signed)
Looked through [employer], found the form from [place]. Per [facility], the kit is not available. They would prefer the RX be broken down into separate RXs for 21, 14, and 48m patches.   Tammy, is it ok to change the RX? Thanks!

## 2016-11-15 MED ORDER — NICOTINE 14 MG/24HR TD PT24: MEDICATED_PATCH | TRANSDERMAL | 0 refills | Status: DC

## 2016-11-15 MED ORDER — NICOTINE 7 MG/24HR TD PT24: MEDICATED_PATCH | TRANSDERMAL | 0 refills | Status: DC

## 2016-11-15 MED ORDER — NICOTINE 21 MG/24HR TD PT24: MEDICATED_PATCH | TRANSDERMAL | 0 refills | Status: DC

## 2016-11-15 NOTE — Telephone Encounter (Signed)
Spoke with pt. And made her aware of TP's  Of message and instructions she understood and had no further questions. The rxs was sent into the pharmacy of pt.'s choice. Nothing further is needed at this time.

## 2016-11-15 NOTE — Telephone Encounter (Signed)
From what I understood when I spoke with the pt is that she is needing 3 separate prescriptions. 1 for 25m, 1 for 113mand 1 for 51m40mHer insurance is not covering the "kit" we sent in.  Is it okay it change to the 3 prescriptions?

## 2016-11-15 NOTE — Telephone Encounter (Signed)
Is this for nicotine patches . Does her insurance pay for this ?  How do they want the instructions?

## 2016-11-15 NOTE — Telephone Encounter (Signed)
 21mg  apply daily for 6 weeks then step down 14mg  apply daily for 2 weeks then step down  7mg  apply daily for 2 weeks . Stop cigarettes at start of treatment .

## 2016-11-19 ENCOUNTER — Telehealth: Payer: Self-pay | Admitting: Adult Health

## 2016-11-19 NOTE — Telephone Encounter (Signed)
Pt aware of TP recommendations & voiced her understanding. Nothing further needed.

## 2016-11-19 NOTE — Telephone Encounter (Signed)
That is fine , decrease prednisone 5mg  daily for 3 days then stop  Please contact office for sooner follow up if symptoms do not improve or worsen or seek emergency care

## 2016-11-19 NOTE — Telephone Encounter (Signed)
Pt aware of TP response and voiced her understanding. Pt is concerned about continuing the prednisone, as she thinks this is what is causing the itching.    TP please advise. Thanks.

## 2016-11-19 NOTE — Telephone Encounter (Signed)
Spoke with pt. States that she is still having issues with itching. Itching is located on her feet, hands, lower arms and legs. States that she has not started Zyrtec or Pepcid due to financial issues. Has been taking Benadryl for the itching but this is causing her to sleep all day. Pt would like TP's recommendations on what to do from here.  TP - please advise. Thanks.

## 2016-11-19 NOTE — Telephone Encounter (Signed)
She will need to be seen if unable to control sx . With benadryl , zyrtec/ pepcid , cool cloths/showers.  May need to see her PCP  Please contact office for sooner follow up if symptoms do not improve or worsen or seek emergency care

## 2016-11-20 ENCOUNTER — Ambulatory Visit: Payer: Medicare Other | Admitting: Adult Health

## 2016-11-27 ENCOUNTER — Other Ambulatory Visit: Payer: Self-pay | Admitting: Internal Medicine

## 2016-12-06 ENCOUNTER — Other Ambulatory Visit: Payer: Self-pay | Admitting: Family Medicine

## 2016-12-06 ENCOUNTER — Ambulatory Visit
Admission: RE | Admit: 2016-12-06 | Discharge: 2016-12-06 | Disposition: A | Discharge status: Home / Self Care | Payer: Medicare Other | Source: Ambulatory Visit | Attending: Family Medicine | Admitting: Family Medicine

## 2016-12-06 DIAGNOSIS — J9611 Chronic respiratory failure with hypoxia: Secondary | ICD-10-CM

## 2017-01-31 ENCOUNTER — Other Ambulatory Visit: Payer: Self-pay | Admitting: Internal Medicine

## 2017-01-31 ENCOUNTER — Telehealth: Payer: Self-pay | Admitting: Adult Health

## 2017-01-31 MED ORDER — BUDESONIDE-FORMOTEROL FUMARATE 80-4.5 MCG/ACT IN AERO: 2.0000 | INHALATION_SPRAY | Freq: Two times a day (BID) | RESPIRATORY_TRACT | 6 refills | Status: DC

## 2017-01-31 NOTE — Telephone Encounter (Signed)
Called and spoke to pt. Pt is requesting a refill of Symbicort, rx sent to preferred pharmacy. Pt verbalized understanding and denied any further questions or concerns at this time.

## 2017-05-07 ENCOUNTER — Telehealth: Payer: Self-pay | Admitting: Internal Medicine

## 2017-05-07 MED ORDER — ALBUTEROL SULFATE HFA 108 (90 BASE) MCG/ACT IN AERS: 2.0000 | INHALATION_SPRAY | Freq: Four times a day (QID) | RESPIRATORY_TRACT | 5 refills | Status: DC | PRN

## 2017-05-07 NOTE — Telephone Encounter (Signed)
Spoke with pt. She is requesting to have a refill on Ventolin. Rx has been sent in. Nothing further was needed.

## 2017-06-05 ENCOUNTER — Telehealth: Payer: Self-pay | Admitting: Internal Medicine

## 2017-06-05 MED ORDER — ALBUTEROL SULFATE HFA 108 (90 BASE) MCG/ACT IN AERS: 2.0000 | INHALATION_SPRAY | Freq: Four times a day (QID) | RESPIRATORY_TRACT | 3 refills | Status: DC | PRN

## 2017-06-05 NOTE — Telephone Encounter (Signed)
Called [facility], who was requesting clinical info for a PA for pt's Proair- pt apparently initiated PA.  I advised that we had been prescribing Ventolin with no complaints from patient.  Per [facility], an override was needed for an early fill for the rx. Called pt's pharmacy ([facility] [address], where most recent albuterol rx was sent on 8/7), pharmacist verified that they fill Ventolin from rx sent from our office- noted that pt does have a Proair rx on file through Dr. Clarene DukeLittle that was sent in Feb 2017.  lmtcb X1 for pt to see why she initiated PA for proair.  Wcb.

## 2017-06-05 NOTE — Telephone Encounter (Signed)
Pharmacist Duygul called - pt called [facility] requesting that her Proair be covered because she "didn't like Ventolin."  No clinical information was provided.  Per pharmacist, the case is pending and cannot be approved until the clinical info is given.  Pt called - spoke with patient who reported that her insurance stopped covering Proair so she tried Ventolin (rx sent 8.7.18 per pt's chart).  However, she finds that she is unable to tolerate this >> feels worse after taking, feels like it "closed her lungs up more", no relief from her wheezing and her cough increased after use.  Pt asking that we try to get her Proair covered again.  [facility] and spoke [facility]ist Selena >> clinical information given: Ventolin ineffective for patient and Xopenex is not appropriate.  Proair was approved x1 year.  Called spoke with patient, informed her of the above. Rx sent to verified pharmacy Med list updated Nothing further needed; will sign off

## 2017-06-28 ENCOUNTER — Other Ambulatory Visit: Payer: Self-pay | Admitting: Internal Medicine

## 2017-07-25 ENCOUNTER — Telehealth: Payer: Self-pay | Admitting: Internal Medicine

## 2017-07-25 NOTE — Telephone Encounter (Signed)
Spoke with pt, she states she has emphysema and feels the congestion is going down in her chest. She is not having a fever. She is coughing with light yellow mucus. She has no money for an office visit but she can possibly come on Monday but wants to know if she can use leftover prednisone. She has 12.5 tablets of Prednisone. Please advise MR.   [facility]

## 2017-07-26 MED ORDER — PREDNISONE 10 MG PO TABS: 10.0000 mg | ORAL_TABLET | Freq: Every day | ORAL | 0 refills | Status: DC

## 2017-07-26 MED ORDER — CEPHALEXIN 500 MG PO CAPS: 500.0000 mg | ORAL_CAPSULE | Freq: Three times a day (TID) | ORAL | 0 refills | Status: DC

## 2017-07-26 NOTE — Telephone Encounter (Signed)
Called and spoke with pt and she stated that she is having congestion with light yellow sputum that the pt stated is normal for her.   Pt stated that she has 12 and 1/2 tablets of the prednisone and she wanted to see if MR felt that she should take these and how?  MR please advise. Thanks  No Known Allergies

## 2017-07-26 NOTE — Telephone Encounter (Signed)
Spoke with pt and notified of recs per MR  She verbalized understanding  Rxs were sent to pharm

## 2017-07-26 NOTE — Telephone Encounter (Signed)
Pt calling back about phone call from yesterday about prednisone.-tr

## 2017-07-26 NOTE — Telephone Encounter (Signed)
She has aecopd  Plan Please take prednisone 40 mg x1 day, then 30 mg x1 day, then 20 mg x1 day, then 10 mg x1 day, and then 5 mg x1 day and stop ( I do not know the doses of the pred tablets she has but above should be the regimen)  Also,  cephalexin 500mg  three times daily x  5 days   Dr. Kalman ShanMurali Petula Rotolo, M.D., [facility] Pulmonary and Critical Care Medicine Staff Physician [place] [facility] [place] [facility] Pager: [phone], If no answer or between  15:00h - 7:00h: call [phone]  07/26/2017 11:13 AM

## 2017-09-02 ENCOUNTER — Other Ambulatory Visit: Payer: Self-pay | Admitting: Internal Medicine

## 2017-09-04 ENCOUNTER — Telehealth: Payer: Self-pay | Admitting: Internal Medicine

## 2017-09-04 NOTE — Telephone Encounter (Signed)
Called and spoke to pt. Pt is requesting a refill of Symbicort. Rx sent to preferred pharmacy earlier today. Pt verbalized understanding and denied any further questions or concerns at this time.

## 2018-01-15 ENCOUNTER — Telehealth: Payer: Self-pay | Admitting: Internal Medicine

## 2018-01-15 MED ORDER — UMECLIDINIUM BROMIDE 62.5 MCG/INH IN AEPB: 1.0000 | INHALATION_SPRAY | Freq: Every day | RESPIRATORY_TRACT | 0 refills | Status: DC

## 2018-01-15 NOTE — Telephone Encounter (Signed)
Called and spoke with patient, she was asking that we send in a refill of her Incruse to get her through the month until her appointment in may. I have sent a refill to her pharmacy. Nothing further needed.

## 2018-02-03 ENCOUNTER — Ambulatory Visit (INDEPENDENT_AMBULATORY_CARE_PROVIDER_SITE_OTHER): Payer: Medicare Other | Admitting: Internal Medicine

## 2018-02-03 ENCOUNTER — Encounter: Payer: Self-pay | Admitting: Internal Medicine

## 2018-02-03 VITALS — BP 110/68 | HR 80 | Ht 59.5 in | Wt 157.8 lb

## 2018-02-03 DIAGNOSIS — Z122 Encounter for screening for malignant neoplasm of respiratory organs: Secondary | ICD-10-CM | POA: Diagnosis not present

## 2018-02-03 DIAGNOSIS — J449 Chronic obstructive pulmonary disease, unspecified: Secondary | ICD-10-CM

## 2018-02-03 DIAGNOSIS — F17200 Nicotine dependence, unspecified, uncomplicated: Secondary | ICD-10-CM | POA: Diagnosis not present

## 2018-02-03 MED ORDER — UMECLIDINIUM BROMIDE 62.5 MCG/INH IN AEPB: 1.0000 | INHALATION_SPRAY | Freq: Every day | RESPIRATORY_TRACT | 6 refills | Status: DC

## 2018-02-03 MED ORDER — BUDESONIDE-FORMOTEROL FUMARATE 80-4.5 MCG/ACT IN AERO: 2.0000 | INHALATION_SPRAY | Freq: Two times a day (BID) | RESPIRATORY_TRACT | 6 refills | Status: DC

## 2018-02-03 MED ORDER — BUDESONIDE-FORMOTEROL FUMARATE 80-4.5 MCG/ACT IN AERO: 2.0000 | INHALATION_SPRAY | Freq: Two times a day (BID) | RESPIRATORY_TRACT | 0 refills | Status: DC

## 2018-02-03 NOTE — Patient Instructions (Addendum)
COPD, very severe (HCC)  - stable disease  - continue symbicort and incruse scheduled as before (discussed trelegy but wil hold off)  - use albuterol as needed - You had allergic reaction to prevnar - with arml swelling needing prednisone  - CMA to document it in allergy  - might need ID clearance before doing pneumovax  Smoking  -glad you quit tobacco and now on vape in an efforr to quit completely - please note vape also harmful  Screening for respiratory organ cancer - refer cancer lung screening program  Folllowup 6-9 months or sooner if needed; CAT score at followup

## 2018-02-03 NOTE — Progress Notes (Signed)
Subjective:     Patient ID: Sonia Bush, female   DOB: 1961-06-13, 57 y.o.   MRN: [id number]  HPI  OV 01/17/2016  Chief Complaint  Patient presents with  . Follow-up    Pt states her breathing is at baseline. Pt states she feels the symbicort makes her wheeze and then feels she needs to use her albuterol hfa.    Follow-up Gold stage IV COPD-not on home oxygen but on triple inhaler therapy: She had an exacerbation July 2016 around the time I started her on triple inhaler therapy. That was treated with prednisone. Then in January 2017 at the time of last visit had another exacerbation treated with anabiotic's and prednisone. She tells me that several weeks after that she saw primary care physician and had to get repeat antibiotics or prednisone of which she is unsure. She currently feels stable. COPD cat score is 26 and reflects a stable baseline COPD which is improved compared to a year ago. Nevertheless she tells me that Symbicort makes a wheeze  Smoking: Continues to smoke. Struggles to quit  New issue is leg cramps: Ever since she started inhalers she's having leg cramps. Self treated with over-the-counter potassium which has helped. When she does not take potassium leg cramps get worse. She wants me to check her chemistry levels.  Lung cancer screening: She is eligible for this but she will need a referral to the cancer screening program.       OV 07/24/2016  Chief Complaint  Patient presents with  . Follow-up    Pt states her SOB is unchanged since last OV. Pt c/o prod cough with white mucus - at baseline. Pt denies CP/tightness and f/c/s.       Follow-up Gold stage IV COPD. Not on home oxygen but on triple  inhaler therapy. Since last visit she continues to smoke. She is unable to pulmonary rehabilitation because of Medicaidlack of coverage. Her triple inhaler therapy is now Incruse and symbicort. She felt Spiriva was causing of thrush. She does not want her to [facility]  or Brio because these were ineffective according to subjective opinion. Current triple inhaler therapy works well for her. She will have a flu shot. There are no new issues.    OV 11/01/2016  Chief Complaint  Patient presents with  . Acute Visit    Pt c/o increase in SOB an prod cough with clear mucus and body aches, fever. Pt states she was given doxy. Pt states she has not taken her temp but she has had chills.    Acute visit for Gold stage IV COPD patient was not on oxygen therapy but on triple inhaler therapy and has continued to smoke.  Approximately one half weeks ago she developed a head cold. She saw her primary care physician and was given one half days of amoxicillin but this did not help. Subsequently on 10/25/2016 because of unimproved symptoms of cough headache or wheezing and subjective feverishness she saw primary care physician again and was prescribed doxycycline. She does not recollect chest x-ray at any time. Of note she is up-to-date with her flu shot. She does not think she has a flu but she does have fatigue. Therefore she decided to come in acutely today and upon arrival was hypoxemic on room air requiring. As of oxygen. This is a new finding for her. She is able to talk sentences but does not desire admission to the hospital. This no edema or hemoptysis or syncope   OV  02/03/2018  Chief Complaint  Patient presents with  . Follow-up    Pt states she has been doing good since last visit. Pt states she did quit smoking cigs but is doing the vape. Denies any complaints of cough, sob, or CP.    Follow-up Gold stage IV COPD not on oxygen therapy but on triple inhaler therapy and heavy smoker  Last seen February 2018 which is 14 months ago.  Since then overall COPD stable without any exacerbation.  She continues with Incruse and Symbicort.  She feels Symbicort works better for her.  In the past she has tried Engineer, materials and this is not helped.  She had questions about Trelegy inhaler  but after talking to her that it was Incruse and Breo combination she does not want to do it.  Moreover she has not had recurrent exacerbations.  Her COPD CAT score is 24 and slightly better than her baseline from 2 years ago.  Last CT scan of the chest was in 2016 without any evidence of lung cancer.  In talking to her she smoked 1 pack a day since age 26 through age 8.  Recently she has converted to vape in an effort to quit smoking but then she says she is liking vape and is worried that she might get addicted to it.  Review of her vaccines show that she has had Prevnar but developed an allergic reaction requiring prednisone with redness and itching in her left upper extremity.  There are no other new issues  CAT COPD Symptom & Quality of Life Score (GSK trademark) 0 is no burden. 5 is highest burden 01/17/2016  02/03/2018   Never Cough -> Cough all the time 3 2  No phlegm in chest -> Chest is full of phlegm 3 3  No chest tightness -> Chest feels very tight 2 2  No dyspnea for 1 flight stairs/hill -> Very dyspneic for 1 flight of stairs 4 5  No limitations for ADL at home -> Very limited with ADL at home 4 4  Confident leaving home -> Not at all confident leaving home 3 3  Sleep soundly -> Do not sleep soundly because of lung condition 3 0  Lots of Energy -> No energy at all 4 5  TOTAL Score (max 40)  26 24      has a past medical history of Anxiety, Asthmatic bronchitis, Chronic pain, COPD (chronic obstructive pulmonary disease) (HCC), and Hypertension.   reports that she quit smoking about 3 months ago. Her smoking use included cigarettes. She has a 46.00 pack-year smoking history. She has never used smokeless tobacco.  Past Surgical History:  Procedure Laterality Date  . ECTOPIC PREGNANCY SURGERY    . TUBAL LIGATION      No Known Allergies  Immunization History  Administered Date(s) Administered  . Influenza Split 08/01/2014, 07/27/2015  . Influenza,inj,Quad PF,6+ Mos 07/24/2016,  08/01/2017  . Pneumococcal Conjugate-13 04/08/2015    Family History  Problem Relation Age of Onset  . Asthma Daughter   . Asthma Daughter   . Skin cancer Father   . Alzheimer's disease Father      Current Outpatient Medications:  .  albuterol (PROAIR HFA) 108 (90 Base) MCG/ACT inhaler, Inhale 2 puffs into the lungs every 6 (six) hours as needed for wheezing or shortness of breath., Disp: 1 Inhaler, Rfl: 3 .  antiseptic oral rinse (BIOTENE) LIQD, 15 mLs by Mouth Rinse route as needed for dry mouth., Disp: , Rfl:  .  clonazePAM (KLONOPIN) 1 MG tablet, Take 1 mg by mouth 2 (two) times daily., Disp: , Rfl:  .  cloNIDine (CATAPRES) 0.2 MG tablet, Take 0.2 mg by mouth at bedtime. , Disp: , Rfl:  .  HYDROcodone-acetaminophen (NORCO/VICODIN) 5-325 MG tablet, Take 1 tablet by mouth every 6 (six) hours as needed for moderate pain., Disp: , Rfl:  .  irbesartan (AVAPRO) 150 MG tablet, , Disp: , Rfl: 3 .  MAGNESIUM PO, Take by mouth daily., Disp: , Rfl:  .  Multiple Vitamins-Minerals (MULTI FOR HER 50+ PO), Take 1 tablet by mouth daily., Disp: , Rfl:  .  Omega-3 Fatty Acids (FISH OIL) 1000 MG CAPS, Take 1 capsule by mouth daily., Disp: , Rfl:  .  Potassium (POTASSIMIN PO), Take by mouth., Disp: , Rfl:  .  Probiotic Product (PROBIOTIC DAILY PO), Take 1 tablet by mouth daily., Disp: , Rfl:  .  SYMBICORT 80-4.5 MCG/ACT inhaler, INHALE 2 PUFFS BY MOUTH TWICE DAILY, Disp: 1 Inhaler, Rfl: 6 .  triamterene-hydrochlorothiazide (DYAZIDE) 37.5-25 MG per capsule, Take 1 capsule by mouth every morning., Disp: , Rfl:  .  umeclidinium bromide (INCRUSE ELLIPTA) 62.5 MCG/INH AEPB, Inhale 1 puff into the lungs daily., Disp: 30 each, Rfl: 0 No current facility-administered medications for this visit.   Facility-Administered Medications Ordered in Other Visits:  .  albuterol (PROVENTIL) (2.5 MG/3ML) 0.083% nebulizer solution 2.5 mg, 2.5 mg, Nebulization, Once, Kalman Shan, MD   Review of Systems      Objective:   Physical Exam  Constitutional: She is oriented to person, place, and time. She appears well-developed and well-nourished. No distress.  HENT:  Head: Normocephalic and atraumatic.  Right Ear: External ear normal.  Left Ear: External ear normal.  Mouth/Throat: Oropharynx is clear and moist. No oropharyngeal exudate.  Eyes: Pupils are equal, round, and reactive to light. Conjunctivae and EOM are normal. Right eye exhibits no discharge. Left eye exhibits no discharge. No scleral icterus.  Neck: Normal range of motion. Neck supple. No JVD present. No tracheal deviation present. No thyromegaly present.  Cardiovascular: Normal rate, regular rhythm, normal heart sounds and intact distal pulses. Exam reveals no gallop and no friction rub.  No murmur heard. Pulmonary/Chest: Effort normal and breath sounds normal. No respiratory distress. She has no wheezes. She has no rales. She exhibits no tenderness.  Abdominal: Soft. Bowel sounds are normal. She exhibits no distension and no mass. There is no tenderness. There is no rebound and no guarding.  Musculoskeletal: Normal range of motion. She exhibits no edema or tenderness.  Lymphadenopathy:    She has no cervical adenopathy.  Neurological: She is alert and oriented to person, place, and time. She has normal reflexes. No cranial nerve deficit. She exhibits normal muscle tone. Coordination normal.  Skin: Skin is warm and dry. No rash noted. She is not diaphoretic. No erythema. No pallor.  Psychiatric: She has a normal mood and affect. Her behavior is normal. Judgment and thought content normal.  Vitals reviewed.  Vitals:   02/03/18 1112  BP: 110/68  Pulse: 80  SpO2: 96%  Weight: 157 lb 12.8 oz (71.6 kg)  Height: 4' 11.5" (1.511 m)    Estimated body mass index is 31.34 kg/m as calculated from the following:   Height as of this encounter: 4' 11.5" (1.511 m).   Weight as of this encounter: 157 lb 12.8 oz (71.6 kg).     Assessment:        ICD-10-CM   1. COPD, very severe (  HCC) J44.9   2. Smoking F17.200   3. Screening for respiratory organ cancer Z12.2        Plan:     COPD, very severe (HCC)  - stable disease  - continue symbicort and incruse scheduled as before (discussed trelegy but wil hold off)  - use albuterol as needed - You had allergic reaction to prevnar - with arml swelling needing prednisone  - CMA to document it in allergy  - might need ID clearance before doing pneumovax  Smoking  -glad you quit tobacco and now on vape in an efforr to quit completely - please note vape also harmful  Screening for respiratory organ cancer - refer cancer lung screening program  Folllowup 6-9 months or sooner if needed; CAT score at followup  Dr. Kalman Shan, M.D., [facility].C.P Pulmonary and Critical Care Medicine Staff Physician, [facility] Director - Interstitial Lung Disease  Program  Pulmonary Fibrosis [facility] [place], [place], [address]  Pager: [phone], If no answer or between  15:00h - 7:00h: call [phone] Telephone: [phone]

## 2018-02-07 ENCOUNTER — Other Ambulatory Visit: Payer: Self-pay | Admitting: Acute Care

## 2018-02-07 DIAGNOSIS — Z122 Encounter for screening for malignant neoplasm of respiratory organs: Secondary | ICD-10-CM

## 2018-02-07 DIAGNOSIS — Z87891 Personal history of nicotine dependence: Secondary | ICD-10-CM

## 2018-02-21 ENCOUNTER — Encounter: Payer: Medicare Other | Admitting: Acute Care

## 2018-02-21 ENCOUNTER — Ambulatory Visit: Admission: RE | Admit: 2018-02-21 | Payer: Medicare Other | Source: Ambulatory Visit

## 2018-03-11 ENCOUNTER — Inpatient Hospital Stay: Admission: RE | Admit: 2018-03-11 | Payer: Medicare Other | Source: Ambulatory Visit

## 2018-03-12 ENCOUNTER — Ambulatory Visit (INDEPENDENT_AMBULATORY_CARE_PROVIDER_SITE_OTHER): Payer: Medicare Other | Admitting: Acute Care

## 2018-03-12 ENCOUNTER — Encounter: Payer: Self-pay | Admitting: Acute Care

## 2018-03-12 ENCOUNTER — Ambulatory Visit (INDEPENDENT_AMBULATORY_CARE_PROVIDER_SITE_OTHER)
Admission: RE | Admit: 2018-03-12 | Discharge: 2018-03-12 | Disposition: A | Discharge status: Home / Self Care | Payer: Medicare Other | Source: Ambulatory Visit | Attending: Acute Care | Admitting: Acute Care

## 2018-03-12 DIAGNOSIS — F17210 Nicotine dependence, cigarettes, uncomplicated: Secondary | ICD-10-CM

## 2018-03-12 DIAGNOSIS — Z122 Encounter for screening for malignant neoplasm of respiratory organs: Secondary | ICD-10-CM

## 2018-03-12 DIAGNOSIS — Z87891 Personal history of nicotine dependence: Secondary | ICD-10-CM

## 2018-03-12 NOTE — Progress Notes (Signed)
Shared Decision Making Visit Lung Cancer Screening Program [id number])   Eligibility:  Age 57 y.o.  Pack Years Smoking History Calculation 45 pack year smoking history (# packs/per year x # years smoked)  Recent History of coughing up blood  no  Unexplained weight loss? no ( >Than 15 pounds within the last 6 months )  Prior History Lung / other cancer no (Diagnosis within the last 5 years already requiring surveillance chest CT Scans).  Smoking Status Former Smoker  Former Smokers: Years since quit: < 1 year  Quit Date: 11/2017  Visit Components:  Discussion included one or more decision making aids. yes  Discussion included risk/benefits of screening. yes  Discussion included potential follow up diagnostic testing for abnormal scans. yes  Discussion included meaning and risk of over diagnosis. yes  Discussion included meaning and risk of False Positives. yes  Discussion included meaning of total radiation exposure. yes  Counseling Included:  Importance of adherence to annual lung cancer LDCT screening. yes  Impact of comorbidities on ability to participate in the program. yes  Ability and willingness to under diagnostic treatment. yes  Smoking Cessation Counseling:  Current Smokers:   Discussed importance of smoking cessation. NA  Information about tobacco cessation classes and interventions provided to patient. yes  Patient provided with "ticket" for LDCT Scan. yes  Symptomatic Patient. no  Counseling  Diagnosis Code: Tobacco Use Z72.0  Asymptomatic Patient yes  Counseling (Intermediate counseling: > three minutes counseling) [id number]  Former Smokers:   Discussed the importance of maintaining cigarette abstinence. yes  Diagnosis Code: Personal History of Nicotine Dependence. U27.253Z87.891  Information about tobacco cessation classes and interventions provided to patient. Yes  Patient provided with "ticket" for LDCT Scan. yes  Written Order for Lung Cancer  Screening with LDCT placed in Epic. Yes (CT Chest Lung Cancer Screening Low Dose W/O CM) [id number] Z12.2-Screening of respiratory organs Z87.891-Personal history of nicotine dependence  I spent 25 minutes of face to face time with Ms. Laurier NancyMcClellan discussing the risks and benefits of lung cancer screening. We viewed a power point together that explained in detail the above noted topics. We took the time to pause the power point at intervals to allow for questions to be asked and answered to ensure understanding. We discussed that she had taken the single most powerful action possible to decrease her risk of developing lung cancer when she quit smoking. I counseled her to remain smoke free, and to contact me if she ever had the desire to smoke again so that I can provide resources and tools to help support the effort to remain smoke free. We discussed the time and location of the scan, and that either  Abigail Miyamotoenise Phelps RN or I will call with the results within  24-48 hours of receiving them. She has my card and contact information in the event she needs to speak with me, in addition to a copy of the power point we reviewed as a resource. She verbalized understanding of all of the above and had no further questions upon leaving the office.     I explained to the patient that there has been a high incidence of coronary artery disease noted on these exams. I explained that this is a non-gated exam therefore degree or severity cannot be determined. This patient is on statin therapy. I have asked the patient to follow-up with their PCP regarding any incidental finding of coronary artery disease and management with diet or medication as they feel is clinically  indicated. The patient verbalized understanding of the above and had no further questions.  03/12/2018 1:11 PM     Bevelyn Ngo, NP

## 2018-03-18 ENCOUNTER — Other Ambulatory Visit: Payer: Self-pay | Admitting: Acute Care

## 2018-03-18 DIAGNOSIS — Z122 Encounter for screening for malignant neoplasm of respiratory organs: Secondary | ICD-10-CM

## 2018-03-18 DIAGNOSIS — Z87891 Personal history of nicotine dependence: Secondary | ICD-10-CM

## 2018-03-31 ENCOUNTER — Telehealth: Payer: Self-pay | Admitting: Internal Medicine

## 2018-03-31 MED ORDER — CLONAZEPAM 1 MG PO TABS: 1.0000 mg | ORAL_TABLET | Freq: Two times a day (BID) | ORAL | 0 refills | Status: AC

## 2018-03-31 NOTE — Telephone Encounter (Signed)
Ok Rx Klonopin 1 mg, # 10, 1 twice daily  Ok 1 sample each of Symbicort and Incruse as requested

## 2018-03-31 NOTE — Telephone Encounter (Signed)
Called and spoke to pt.  Pt is requesting Rx for Klonopin 1mg  to get her by until 04/07/18, as her PCP office is closed for the fourth of july.  Pt also requested sample of symbicort 80 and incruse, if she had to come by our office to pick Rx for Klonopin up. I have made pt aware that Rx for Klonopin can be phoned in to pharmacy, if approved by DOD.  Pt stated that she would prefer not to come ou in the heat to get samples.  CY please advise on Klonopin Rx, as MR is unavailable. Thanks.  Current Outpatient Medications on File Prior to Visit  Medication Sig Dispense Refill  . albuterol (PROAIR HFA) 108 (90 Base) MCG/ACT inhaler Inhale 2 puffs into the lungs every 6 (six) hours as needed for wheezing or shortness of breath. 1 Inhaler 3  . antiseptic oral rinse (BIOTENE) LIQD 15 mLs by Mouth Rinse route as needed for dry mouth.    . budesonide-formoterol (SYMBICORT) 80-4.5 MCG/ACT inhaler Inhale 2 puffs into the lungs 2 (two) times daily. 1 Inhaler 6  . budesonide-formoterol (SYMBICORT) 80-4.5 MCG/ACT inhaler Inhale 2 puffs into the lungs 2 (two) times daily. 1 Inhaler 0  . clonazePAM (KLONOPIN) 1 MG tablet Take 1 mg by mouth 2 (two) times daily.    . cloNIDine (CATAPRES) 0.2 MG tablet Take 0.2 mg by mouth at bedtime.     Marland Kitchen. HYDROcodone-acetaminophen (NORCO/VICODIN) 5-325 MG tablet Take 1 tablet by mouth every 6 (six) hours as needed for moderate pain.    Marland Kitchen. irbesartan (AVAPRO) 150 MG tablet   3  . MAGNESIUM PO Take by mouth daily.    . Multiple Vitamins-Minerals (MULTI FOR HER 50+ PO) Take 1 tablet by mouth daily.    . Omega-3 Fatty Acids (FISH OIL) 1000 MG CAPS Take 1 capsule by mouth daily.    . Potassium (POTASSIMIN PO) Take by mouth.    . Probiotic Product (PROBIOTIC DAILY PO) Take 1 tablet by mouth daily.    Marland Kitchen. triamterene-hydrochlorothiazide (DYAZIDE) 37.5-25 MG per capsule Take 1 capsule by mouth every morning.    . umeclidinium bromide (INCRUSE ELLIPTA) 62.5 MCG/INH AEPB Inhale 1 puff into  the lungs daily. 1 each 6   Current Facility-Administered Medications on File Prior to Visit  Medication Dose Route Frequency Provider Last Rate Last Dose  . albuterol (PROVENTIL) (2.5 MG/3ML) 0.083% nebulizer solution 2.5 mg  2.5 mg Nebulization Once Kalman Shanamaswamy, Murali, MD        No Known Allergies

## 2018-03-31 NOTE — Telephone Encounter (Signed)
Spoke with pt. She is aware of CY's response. Rx for Klonopin has been called in. Pt did not want to come out in the heat to get the samples. They will not be left up front. Nothing further was needed.

## 2018-07-02 ENCOUNTER — Other Ambulatory Visit: Payer: Self-pay | Admitting: Internal Medicine

## 2018-07-16 ENCOUNTER — Telehealth: Payer: Self-pay | Admitting: Internal Medicine

## 2018-07-16 NOTE — Telephone Encounter (Signed)
Called and spoke to patient. Patient stated that [employer] will be sending [place] a form saying Ventolin is preferred over the Proair. Patient stated that she can not tolerate the ventolin and previously the Proair was able to be approved. Called  Tracks and apparently patient has part D for drug coverage and we don't currently have the card to be able to submit a PA. Called and let the patient know that we don't have that card. Patient stated that [employer] Hawks has a copy and that she will call them and have them initiate a PA for the Proair. Will await PA to be faxed from [facility].

## 2018-07-17 NOTE — Telephone Encounter (Signed)
Received PA for Proair  Initiated the request in CMM  KEY [id number] Will forward to EP to f/u on approval/denial

## 2018-07-17 NOTE — Telephone Encounter (Signed)
Information from [email or web address]: [id number] - PA Case ID: [id number] - Rx #: [id number] Need help? Call us at [phone]  Outcome  Approvedtoday  Your request has been approved  DrugProAir HFA 108 (90 Base)MCG/ACT aerosol  [facility] Electronic PA Form  Original Claim [id number] DISPENSE Cherlyn Labella HFA(PHARMACY HELP DESK [phone])  Health Plan's Preferred Products  VENTOLIN HFA AER [id number]  XOPENEX HFA AER [id number]   Called pt's pharmacy and spoke with Katrina letting her know that the PA results. Katrina expressed understanding.nothing further needed.

## 2018-09-02 ENCOUNTER — Other Ambulatory Visit: Payer: Self-pay | Admitting: Internal Medicine

## 2018-09-09 ENCOUNTER — Other Ambulatory Visit: Payer: Self-pay | Admitting: Family Medicine

## 2018-09-09 DIAGNOSIS — Z1231 Encounter for screening mammogram for malignant neoplasm of breast: Secondary | ICD-10-CM

## 2018-09-09 DIAGNOSIS — R5381 Other malaise: Secondary | ICD-10-CM

## 2018-09-10 ENCOUNTER — Ambulatory Visit: Payer: Medicare Other

## 2018-10-27 ENCOUNTER — Other Ambulatory Visit: Payer: Self-pay | Admitting: Obstetrics

## 2018-10-27 ENCOUNTER — Ambulatory Visit (INDEPENDENT_AMBULATORY_CARE_PROVIDER_SITE_OTHER): Payer: Medicare Other | Admitting: Obstetrics

## 2018-10-27 ENCOUNTER — Encounter: Payer: Self-pay | Admitting: Obstetrics

## 2018-10-27 ENCOUNTER — Other Ambulatory Visit (HOSPITAL_COMMUNITY)
Admission: RE | Admit: 2018-10-27 | Discharge: 2018-10-27 | Disposition: A | Discharge status: Home / Self Care | Payer: Medicare Other | Source: Ambulatory Visit | Attending: Obstetrics | Admitting: Obstetrics

## 2018-10-27 VITALS — BP 123/82 | HR 87 | Ht 59.0 in | Wt 111.0 lb

## 2018-10-27 DIAGNOSIS — N6489 Other specified disorders of breast: Secondary | ICD-10-CM

## 2018-10-27 DIAGNOSIS — N898 Other specified noninflammatory disorders of vagina: Secondary | ICD-10-CM | POA: Diagnosis present

## 2018-10-27 DIAGNOSIS — Z01419 Encounter for gynecological examination (general) (routine) without abnormal findings: Secondary | ICD-10-CM | POA: Diagnosis present

## 2018-10-27 DIAGNOSIS — Z1239 Encounter for other screening for malignant neoplasm of breast: Secondary | ICD-10-CM

## 2018-10-27 DIAGNOSIS — Z124 Encounter for screening for malignant neoplasm of cervix: Secondary | ICD-10-CM | POA: Diagnosis not present

## 2018-10-27 DIAGNOSIS — Z780 Asymptomatic menopausal state: Secondary | ICD-10-CM

## 2018-10-27 NOTE — Progress Notes (Signed)
Subjective:        Sonia Bush is a 58 y.o. female here for a routine exam.  Current complaints: None.    Personal health questionnaire:  Is patient Ashkenazi Jewish, have a family history of breast and/or ovarian cancer: no Is there a family history of uterine cancer diagnosed at age < 49, gastrointestinal cancer, urinary tract cancer, family member who is a Personnel officer syndrome-associated carrier: no Is the patient overweight and hypertensive, family history of diabetes, personal history of gestational diabetes, preeclampsia or PCOS: no Is patient over 14, have PCOS,  family history of premature CHD under age 20, diabetes, smoke, have hypertension or peripheral artery disease:  no At any time, has a partner hit, kicked or otherwise hurt or frightened you?: no Over the past 2 weeks, have you felt down, depressed or hopeless?: no Over the past 2 weeks, have you felt little interest or pleasure in doing things?:no   Gynecologic History No LMP recorded. Patient is postmenopausal. Contraception: post menopausal status Last Pap: 2016. Results were: normal Last mammogram: 2016. Results were: normal  Obstetric History OB History  No obstetric history on file.    Past Medical History:  Diagnosis Date  . Anxiety   . Asthmatic bronchitis   . Chronic pain   . COPD (chronic obstructive pulmonary disease) (HCC)   . Hypertension     Past Surgical History:  Procedure Laterality Date  . ECTOPIC PREGNANCY SURGERY    . TUBAL LIGATION       Current Outpatient Medications:  .  budesonide-formoterol (SYMBICORT) 80-4.5 MCG/ACT inhaler, Inhale 2 puffs into the lungs 2 (two) times daily., Disp: 1 Inhaler, Rfl: 0 .  clonazePAM (KLONOPIN) 1 MG tablet, Take 1 tablet (1 mg total) by mouth 2 (two) times daily., Disp: 10 tablet, Rfl: 0 .  cloNIDine (CATAPRES) 0.2 MG tablet, Take 0.2 mg by mouth at bedtime. , Disp: , Rfl:  .  HYDROcodone-acetaminophen (NORCO/VICODIN) 5-325 MG tablet, Take 1  tablet by mouth every 6 (six) hours as needed for moderate pain., Disp: , Rfl:  .  INCRUSE ELLIPTA 62.5 MCG/INH AEPB, INHALE 1 PUFF BY MOUTH ONCE DAILY, Disp: 30 each, Rfl: 3 .  irbesartan (AVAPRO) 150 MG tablet, , Disp: , Rfl: 3 .  MAGNESIUM PO, Take by mouth daily., Disp: , Rfl:  .  Multiple Vitamins-Minerals (MULTI FOR HER 50+ PO), Take 1 tablet by mouth daily., Disp: , Rfl:  .  Potassium (POTASSIMIN PO), Take by mouth., Disp: , Rfl:  .  PROAIR HFA 108 (90 Base) MCG/ACT inhaler, INHALE 2 PUFFS BY MOUTH EVERY 6 HOURS AS NEEDED FOR  WHEEZING  OR  SHORTNESS  OF  BREATH, Disp: 9 each, Rfl: 3 .  SYMBICORT 80-4.5 MCG/ACT inhaler, INHALE 2 PUFFS BY MOUTH TWICE DAILY, Disp: 10.2 g, Rfl: 3 .  antiseptic oral rinse (BIOTENE) LIQD, 15 mLs by Mouth Rinse route as needed for dry mouth., Disp: , Rfl:  .  Omega-3 Fatty Acids (FISH OIL) 1000 MG CAPS, Take 1 capsule by mouth daily., Disp: , Rfl:  .  Probiotic Product (PROBIOTIC DAILY PO), Take 1 tablet by mouth daily., Disp: , Rfl:  .  triamterene-hydrochlorothiazide (DYAZIDE) 37.5-25 MG per capsule, Take 1 capsule by mouth every morning., Disp: , Rfl:  No current facility-administered medications for this visit.   Facility-Administered Medications Ordered in Other Visits:  .  albuterol (PROVENTIL) (2.5 MG/3ML) 0.083% nebulizer solution 2.5 mg, 2.5 mg, Nebulization, Once, Kalman Shan, MD No Known Allergies  Social  History   Tobacco Use  . Smoking status: Former Smoker    Packs/day: 1.00    Years: 46.00    Pack years: 46.00    Types: Cigarettes    Last attempt to quit: 11/01/2017    Years since quitting: 0.9  . Smokeless tobacco: Never Used  Substance Use Topics  . Alcohol use: Yes    Alcohol/week: 14.0 standard drinks    Types: 14 Cans of beer per week    Family History  Problem Relation Age of Onset  . Asthma Daughter   . Asthma Daughter   . Skin cancer Father   . Alzheimer's disease Father       Review of Systems  Constitutional:  negative for fatigue and weight loss Respiratory: negative for cough and wheezing Cardiovascular: negative for chest pain, fatigue and palpitations Gastrointestinal: negative for abdominal pain and change in bowel habits Musculoskeletal:negative for myalgias Neurological: negative for gait problems and tremors Behavioral/Psych: negative for abusive relationship, depression Endocrine: negative for temperature intolerance    Genitourinary:negative for abnormal menstrual periods, genital lesions, hot flashes, sexual problems and vaginal discharge Integument/breast: negative for breast lump, breast tenderness, nipple discharge and skin lesion(s)    Objective:       BP 123/82   Pulse 87   Ht 4\' 11"  (1.499 m)   Wt 111 lb (50.3 kg)   BMI 22.42 kg/m  General:   alert  Skin:   no rash or abnormalities  Lungs:   clear to auscultation bilaterally  Heart:   regular rate and rhythm, S1, S2 normal, no murmur, click, rub or gallop  Breasts:   normal without suspicious masses, skin or nipple changes or axillary nodes  Abdomen:  normal findings: no organomegaly, soft, non-tender and no hernia  Pelvis:  External genitalia: normal general appearance Urinary system: urethral meatus normal and bladder without fullness, nontender Vaginal: normal without tenderness, induration or masses Cervix: normal appearance Adnexa: normal bimanual exam Uterus: anteverted and non-tender, normal size   Lab Review Urine pregnancy test Labs reviewed yes Radiologic studies reviewed yes  50% of 20 min visit spent on counseling and coordination of care.   Assessment:     1. Encounter for routine gynecological examination with Papanicolaou smear of cervix Rx: - Cytology - PAP( [facility])  2. Postmenopause - doing well  3. Screening breast examination  4. Vaginal discharge Rx: - Cervicovaginal ancillary only   Plan:    Education reviewed: calcium supplements, depression evaluation, low fat, low  cholesterol diet, safe sex/STD prevention, self breast exams, skin cancer screening and weight bearing exercise. Mammogram ordered. Follow up in: 2 years.   No orders of the defined types were placed in this encounter.  Orders Placed This Encounter  Procedures  . MM DIAG BREAST TOMO BILATERAL    10/27/18 pt feels hardness and fullness /     Standing Status:   Future    Standing Expiration Date:   12/26/2019    Order Specific Question:   Reason for Exam (SYMPTOM  OR DIAGNOSIS REQUIRED)    Answer:   Screening    Order Specific Question:   Is the patient pregnant?    Answer:   No    Order Specific Question:   Preferred imaging location?    Answer:   [facility]     Brock Bad MD 10-27-2018

## 2018-10-28 LAB — CERVICOVAGINAL ANCILLARY ONLY
Bacterial vaginitis: NEGATIVE
Candida vaginitis: NEGATIVE
Chlamydia: NEGATIVE
Neisseria Gonorrhea: NEGATIVE
Trichomonas: NEGATIVE

## 2018-10-29 LAB — CYTOLOGY - PAP
Diagnosis: NEGATIVE
HPV: NOT DETECTED

## 2018-10-31 ENCOUNTER — Ambulatory Visit
Admission: RE | Admit: 2018-10-31 | Discharge: 2018-10-31 | Disposition: A | Discharge status: Home / Self Care | Payer: Medicare Other | Source: Ambulatory Visit | Attending: Obstetrics | Admitting: Obstetrics

## 2018-10-31 ENCOUNTER — Other Ambulatory Visit: Payer: Self-pay | Admitting: Obstetrics

## 2018-10-31 DIAGNOSIS — N6489 Other specified disorders of breast: Secondary | ICD-10-CM

## 2018-12-08 ENCOUNTER — Telehealth: Payer: Self-pay | Admitting: Internal Medicine

## 2018-12-08 NOTE — Telephone Encounter (Signed)
Called and spoke with pt clarifying if she needed a refill and pt stated that she just picked up the last refill of both symbicort and incruse. Pt wanted to know if she needed an appt to follow up due to this and I stated to pt that she is due for a f/u appt.  appt has been scheduled for pt with Angus Seller, NP Wednesday, 12/10/2018 at 2:30. Pt is aware of new address. Nothing further needed.

## 2018-12-10 ENCOUNTER — Ambulatory Visit (INDEPENDENT_AMBULATORY_CARE_PROVIDER_SITE_OTHER): Payer: Medicare Other | Admitting: Nurse Practitioner

## 2018-12-10 ENCOUNTER — Other Ambulatory Visit: Payer: Self-pay

## 2018-12-10 ENCOUNTER — Encounter: Payer: Self-pay | Admitting: Nurse Practitioner

## 2018-12-10 VITALS — BP 112/68 | HR 98 | Ht 59.0 in | Wt 106.8 lb

## 2018-12-10 DIAGNOSIS — J449 Chronic obstructive pulmonary disease, unspecified: Secondary | ICD-10-CM | POA: Diagnosis not present

## 2018-12-10 DIAGNOSIS — F17200 Nicotine dependence, unspecified, uncomplicated: Secondary | ICD-10-CM

## 2018-12-10 MED ORDER — BUDESONIDE-FORMOTEROL FUMARATE 80-4.5 MCG/ACT IN AERO: 2.0000 | INHALATION_SPRAY | Freq: Two times a day (BID) | RESPIRATORY_TRACT | 0 refills | Status: DC

## 2018-12-10 MED ORDER — PROAIR HFA 108 (90 BASE) MCG/ACT IN AERS: INHALATION_SPRAY | RESPIRATORY_TRACT | 3 refills | Status: DC

## 2018-12-10 MED ORDER — BUDESONIDE-FORMOTEROL FUMARATE 80-4.5 MCG/ACT IN AERO: 2.0000 | INHALATION_SPRAY | Freq: Two times a day (BID) | RESPIRATORY_TRACT | 3 refills | Status: DC

## 2018-12-10 MED ORDER — UMECLIDINIUM BROMIDE 62.5 MCG/INH IN AEPB: 1.0000 | INHALATION_SPRAY | Freq: Every day | RESPIRATORY_TRACT | 0 refills | Status: DC

## 2018-12-10 MED ORDER — UMECLIDINIUM BROMIDE 62.5 MCG/INH IN AEPB: 1.0000 | INHALATION_SPRAY | Freq: Every day | RESPIRATORY_TRACT | 3 refills | Status: DC

## 2018-12-10 NOTE — Progress Notes (Signed)
@Patient  ID: Sonia Bush, female    DOB: July 11, 1961, 58 y.o.   MRN: [id number]  Chief Complaint  Patient presents with   COPD    Wants to make sure medications are able to be filled based on last office visit.     Referring provider: Aida Puffer, MD  HPI  58 year old female former smoker with Gold stage IV COPD on oxygen therapy but on triple inhaler therapy who is followed by Dr. Marchelle Gearing.  Tests: Chest CT 03/01/18 - Lung-RADS 2, benign appearance or behavior. Continue annual screening with low-dose chest CT without contrast in 12 months. CXR 12/07/16 - No active cardiopulmonary disease.  CAT Score 12/10/2018 02/03/2018  Total CAT Score 22 24   PFT Results Latest Ref Rng & Units 03/31/2015  FVC-Pre L 1.33  FVC-Predicted Pre % 45  FVC-Post L 1.97  FVC-Predicted Post % 68  Pre FEV1/FVC % % 39  Post FEV1/FCV % % 34  FEV1-Pre L 0.51  FEV1-Predicted Pre % 22  FEV1-Post L 0.66  DLCO UNC% % 25  DLCO COR %Predicted % 42   Last OV 02/03/18 with Dr. Marchelle Gearing: Follow-up Gold stage IV COPD not on oxygen therapy but on triple inhaler therapy and heavy smoker  Last seen February 2018 which is 14 months ago.  Since then overall COPD stable without any exacerbation.  She continues with Incruse and Symbicort.  She feels Symbicort works better for her.  In the past she has tried Engineer, materials and this is not helped.  She had questions about Trelegy inhaler but after talking to her that it was Incruse and Breo combination she does not want to do it.  Moreover she has not had recurrent exacerbations.  Her COPD CAT score is 24 and slightly better than her baseline from 2 years ago.  Last CT scan of the chest was in 2016 without any evidence of lung cancer.  In talking to her she smoked 1 pack a day since age 19 through age 4.  Recently she has converted to vape in an effort to quit smoking but then she says she is liking vape and is worried that she might get addicted to it.  Review of her vaccines  show that she has had Prevnar but developed an allergic reaction requiring prednisone with redness and itching in her left upper extremity.  There are no other new issues Plan: COPD, very severe (HCC) - stable disease  - continue symbicort and incruse scheduled as before (discussed trelegy but wil hold off)  - use albuterol as needed - You had allergic reaction to prevnar - with arml swelling needing prednisone             - CMA to document it in allergy             - might need ID clearance before doing pneumovax Smoking  -glad you quit tobacco and now on vape in an efforr to quit completely - please note vape also harmful Screening for respiratory organ cancer - refer cancer lung screening program  OV 12/10/18 - follow up [facility] today for routine follow-up.  She was last seen by Dr. Marchelle Gearing on 02/03/2018.  Since that time her COPD has been stable without any exacerbations.  She continues with Incruse and Symbicort.  Her CAT score today is 22 which is slightly better than last visit.  Patient has quit smoking but she states that she does vape.  Patient states that she is recently had a follow-up with  her PCP and is newly diagnosed with type 2 diabetes.  Patient is compliant with Symbicort and Incruse.  Uses pro-air if needed for shortness of breath.  He is needing refills on her inhalers today.  She has not needed to use this in quite some time. Denies f/c/s, n/v/d, hemoptysis, PND, leg swelling.  She has seen Kandice RobinsonsSarah Groce. NP since her last visit with Dr. Marchelle Gearingamaswamy for lung cancer screening.      No Known Allergies  Immunization History  Administered Date(s) Administered   Influenza Split 08/01/2014, 07/27/2015   Influenza,inj,Quad PF,6+ Mos 07/24/2016, 08/01/2017, 07/01/2018   Pneumococcal Conjugate-13 04/08/2015    Past Medical History:  Diagnosis Date   Anxiety    Asthmatic bronchitis    Chronic pain    COPD (chronic obstructive pulmonary disease) (HCC)     Hypertension     Tobacco History: Social History   Tobacco Use  Smoking Status Former Smoker   Packs/day: 1.00   Years: 46.00   Pack years: 46.00   Types: Cigarettes   Last attempt to quit: 11/01/2017   Years since quitting: 1.1  Smokeless Tobacco Never Used   Counseling given: Yes   Outpatient Encounter Medications as of 12/10/2018  Medication Sig   antiseptic oral rinse (BIOTENE) LIQD 15 mLs by Mouth Rinse route as needed for dry mouth.   clonazePAM (KLONOPIN) 1 MG tablet Take 1 tablet (1 mg total) by mouth 2 (two) times daily. (Patient taking differently: Take 1 mg by mouth 2 (two) times daily. 0.25 tablet twice daily. Per patient stated she is trying to slowly get off of the medication.)   cloNIDine (CATAPRES) 0.2 MG tablet Take 0.2 mg by mouth at bedtime.    HYDROcodone-acetaminophen (NORCO) 10-325 MG tablet Take 1 tablet by mouth every 6 (six) hours as needed for moderate pain.    irbesartan (AVAPRO) 150 MG tablet    MAGNESIUM PO Take by mouth daily.   Multiple Vitamins-Minerals (MULTI FOR HER 50+ PO) Take 1 tablet by mouth daily.   Potassium (POTASSIMIN PO) Take by mouth.   PROAIR HFA 108 (90 Base) MCG/ACT inhaler INHALE 2 PUFFS BY MOUTH EVERY 6 HOURS AS NEEDED FOR  WHEEZING  OR  SHORTNESS  OF  BREATH   [DISCONTINUED] budesonide-formoterol (SYMBICORT) 80-4.5 MCG/ACT inhaler Inhale 2 puffs into the lungs 2 (two) times daily for 30 days.   [DISCONTINUED] INCRUSE ELLIPTA 62.5 MCG/INH AEPB INHALE 1 PUFF BY MOUTH ONCE DAILY   [DISCONTINUED] PROAIR HFA 108 (90 Base) MCG/ACT inhaler INHALE 2 PUFFS BY MOUTH EVERY 6 HOURS AS NEEDED FOR  WHEEZING  OR  SHORTNESS  OF  BREATH   [DISCONTINUED] SYMBICORT 80-4.5 MCG/ACT inhaler INHALE 2 PUFFS BY MOUTH TWICE DAILY   [DISCONTINUED] umeclidinium bromide (INCRUSE ELLIPTA) 62.5 MCG/INH AEPB Inhale 1 puff into the lungs daily.   budesonide-formoterol (SYMBICORT) 80-4.5 MCG/ACT inhaler Inhale 2 puffs into the lungs 2 (two) times  daily.   metFORMIN (GLUCOPHAGE-XR) 500 MG 24 hr tablet TAKE 1 TABLET DAILY WITH A MEAL FOR 7 DAYS, THEN INCREASE TO 2 PILLS DAILY FOR 7 DAYS.   umeclidinium bromide (INCRUSE ELLIPTA) 62.5 MCG/INH AEPB Inhale 1 puff into the lungs daily.   [DISCONTINUED] budesonide-formoterol (SYMBICORT) 80-4.5 MCG/ACT inhaler Inhale 2 puffs into the lungs 2 (two) times daily. (Patient not taking: Reported on 12/10/2018)   [DISCONTINUED] budesonide-formoterol (SYMBICORT) 80-4.5 MCG/ACT inhaler Inhale 2 puffs into the lungs 2 (two) times daily.   [DISCONTINUED] Omega-3 Fatty Acids (FISH OIL) 1000 MG CAPS Take 1 capsule by  mouth daily.   [DISCONTINUED] Probiotic Product (PROBIOTIC DAILY PO) Take 1 tablet by mouth daily.   [DISCONTINUED] triamterene-hydrochlorothiazide (DYAZIDE) 37.5-25 MG per capsule Take 1 capsule by mouth every morning.   Facility-Administered Encounter Medications as of 12/10/2018  Medication   albuterol (PROVENTIL) (2.5 MG/3ML) 0.083% nebulizer solution 2.5 mg     Review of Systems  Review of Systems  Constitutional: Negative.  Negative for chills and fever.  HENT: Negative.   Respiratory: Negative for cough, shortness of breath and wheezing.   Cardiovascular: Negative.  Negative for chest pain, palpitations and leg swelling.  Gastrointestinal: Negative.   Allergic/Immunologic: Negative.   Neurological: Negative.   Psychiatric/Behavioral: Negative.        Physical Exam  BP 112/68 (BP Location: Left Arm, Patient Position: Sitting, Cuff Size: Normal)    Pulse 98    Ht 4\' 11"  (1.499 m)    Wt 106 lb 12.8 oz (48.4 kg)    SpO2 92%    BMI 21.57 kg/m   Wt Readings from Last 5 Encounters:  12/10/18 106 lb 12.8 oz (48.4 kg)  10/27/18 111 lb (50.3 kg)  02/03/18 157 lb 12.8 oz (71.6 kg)  11/06/16 108 lb 12.8 oz (49.4 kg)  11/01/16 112 lb (50.8 kg)     Physical Exam Vitals signs and nursing note reviewed.  Constitutional:      General: She is not in acute distress.     Appearance: She is well-developed.  Cardiovascular:     Rate and Rhythm: Normal rate and regular rhythm.  Pulmonary:     Effort: Pulmonary effort is normal. No respiratory distress.     Breath sounds: Normal breath sounds. No wheezing or rhonchi.  Musculoskeletal:        General: No swelling.  Neurological:     Mental Status: She is alert and oriented to person, place, and time.       Assessment & Plan:   COPD, very severe Stable interval. No recent exacerbations.  Patient Instructions  Continue Symbicort Continue Incruse Elilipta Continue Proair HFA  Please quit vaping   Follow up with Dr. Marchelle Gearing in 6 months or sooner if needed    Smoking Smoking cessation instruction/counseling given:  counseled patient on the dangers of tobacco use, advised patient to stop smoking, and reviewed strategies to maximize success      Ivonne Andrew, NP 12/10/2018

## 2018-12-10 NOTE — Assessment & Plan Note (Signed)
Stable interval. No recent exacerbations.  Patient Instructions  Continue Symbicort Continue Incruse Elilipta Continue Proair HFA  Please quit vaping   Follow up with Dr. Marchelle Gearing in 6 months or sooner if needed

## 2018-12-10 NOTE — Patient Instructions (Addendum)
Continue Symbicort Continue Incruse Elilipta Continue Proair HFA  Please quit vaping   Follow up with Dr. Marchelle Gearing in 6 months or sooner if needed

## 2018-12-10 NOTE — Assessment & Plan Note (Signed)
Smoking cessation instruction/counseling given:  counseled patient on the dangers of tobacco use, advised patient to stop smoking, and reviewed strategies to maximize success 

## 2018-12-17 ENCOUNTER — Telehealth: Payer: Self-pay | Admitting: Internal Medicine

## 2018-12-17 NOTE — Telephone Encounter (Signed)
Called and spoke with pt who stated she received a letter from [employer] stating they supplied her a temporary supply of symbicort 80 but the med is either not included or not on list of formulary or is included but could be a limited supply med.  Per insurance, pt should switch to a med that is on their formulary list.  Pt stated that the Symbicort has been working well for her and does not want to change to a different med if she does not have to.  Stated to pt to call insurance company to get formulary list of covered meds so we can review. Pt expressed understanding and stated she would call insurance company as soon as she could. Nothing further needed.

## 2019-04-10 ENCOUNTER — Other Ambulatory Visit: Payer: Self-pay | Admitting: *Deleted

## 2019-04-10 DIAGNOSIS — Z122 Encounter for screening for malignant neoplasm of respiratory organs: Secondary | ICD-10-CM

## 2019-04-10 DIAGNOSIS — Z87891 Personal history of nicotine dependence: Secondary | ICD-10-CM

## 2019-04-18 ENCOUNTER — Other Ambulatory Visit: Payer: Self-pay | Admitting: Nurse Practitioner

## 2019-04-18 DIAGNOSIS — J449 Chronic obstructive pulmonary disease, unspecified: Secondary | ICD-10-CM

## 2019-05-13 ENCOUNTER — Telehealth: Payer: Self-pay | Admitting: Internal Medicine

## 2019-05-13 DIAGNOSIS — J449 Chronic obstructive pulmonary disease, unspecified: Secondary | ICD-10-CM

## 2019-05-13 MED ORDER — INCRUSE ELLIPTA 62.5 MCG/INH IN AEPB: 1.0000 | INHALATION_SPRAY | Freq: Every day | RESPIRATORY_TRACT | 3 refills | Status: DC

## 2019-05-13 NOTE — Telephone Encounter (Signed)
Returned call to patient who states pharmacy had sent request to refill Incruse. Incruse refilled. Nothing further needed.

## 2019-05-26 ENCOUNTER — Inpatient Hospital Stay: Admission: RE | Admit: 2019-05-26 | Payer: Medicare Other | Source: Ambulatory Visit

## 2019-06-30 ENCOUNTER — Ambulatory Visit (INDEPENDENT_AMBULATORY_CARE_PROVIDER_SITE_OTHER)
Admission: RE | Admit: 2019-06-30 | Discharge: 2019-06-30 | Disposition: A | Discharge status: Home / Self Care | Payer: Medicare Other | Source: Ambulatory Visit | Attending: Acute Care | Admitting: Acute Care

## 2019-06-30 ENCOUNTER — Other Ambulatory Visit: Payer: Self-pay

## 2019-06-30 DIAGNOSIS — Z122 Encounter for screening for malignant neoplasm of respiratory organs: Secondary | ICD-10-CM

## 2019-06-30 DIAGNOSIS — Z87891 Personal history of nicotine dependence: Secondary | ICD-10-CM | POA: Diagnosis not present

## 2019-07-02 ENCOUNTER — Other Ambulatory Visit: Payer: Self-pay | Admitting: *Deleted

## 2019-07-02 DIAGNOSIS — Z122 Encounter for screening for malignant neoplasm of respiratory organs: Secondary | ICD-10-CM

## 2019-07-02 DIAGNOSIS — Z87891 Personal history of nicotine dependence: Secondary | ICD-10-CM

## 2019-08-03 ENCOUNTER — Telehealth: Payer: Self-pay | Admitting: Internal Medicine

## 2019-08-03 NOTE — Telephone Encounter (Signed)
Patient states she needs a PA for [employer].   Called pharmacy to verify, pharmacist states that Ventolin is the preferred albuterol inhaler on her plan and she has a rx ready for pickup.   lmtcb for pt to make aware of this.

## 2019-08-04 NOTE — Telephone Encounter (Signed)
I called pt but there was no answer. VM stated memory was full so I couldn't leave message on VM.

## 2019-08-06 NOTE — Telephone Encounter (Signed)
Called and spoke to pt. Pt states she cannot tolerate the ventolin, it does not work for her. Called [facility] at [phone] and spoke with a rep. Initiated PA over the phone for pt's Proair. A determination will be made within 72 hours and a decision will be made via fax. Case #: [id number].

## 2019-08-11 NOTE — Telephone Encounter (Signed)
Called [facility] to check determination of PA that was submitted. Per Cedric, the PA for pt's Proair inhaler was approved effective 05/08/2019-08/05/2020.  Called pt to see if she had been able to get the proair inhaler and she said that she received a letter in the mail stating that the PA was approved but pt has not heard from pharmacy yet. Stated to pt to call pharmacy and they should be able to get Rx ready for her and pt verbalized understanding. Nothing further needed.

## 2019-09-09 ENCOUNTER — Other Ambulatory Visit: Payer: Self-pay | Admitting: Nurse Practitioner

## 2019-09-09 DIAGNOSIS — J449 Chronic obstructive pulmonary disease, unspecified: Secondary | ICD-10-CM

## 2019-11-15 ENCOUNTER — Other Ambulatory Visit: Payer: Self-pay | Admitting: Internal Medicine

## 2019-12-14 ENCOUNTER — Ambulatory Visit (INDEPENDENT_AMBULATORY_CARE_PROVIDER_SITE_OTHER): Payer: Medicare Other | Admitting: Internal Medicine

## 2019-12-14 ENCOUNTER — Encounter: Payer: Self-pay | Admitting: Internal Medicine

## 2019-12-14 ENCOUNTER — Other Ambulatory Visit: Payer: Self-pay

## 2019-12-14 VITALS — BP 108/64 | HR 98 | Ht 59.0 in | Wt 101.6 lb

## 2019-12-14 DIAGNOSIS — Z87891 Personal history of nicotine dependence: Secondary | ICD-10-CM

## 2019-12-14 DIAGNOSIS — Z122 Encounter for screening for malignant neoplasm of respiratory organs: Secondary | ICD-10-CM

## 2019-12-14 DIAGNOSIS — J449 Chronic obstructive pulmonary disease, unspecified: Secondary | ICD-10-CM

## 2019-12-14 DIAGNOSIS — Z7185 Encounter for immunization safety counseling: Secondary | ICD-10-CM

## 2019-12-14 DIAGNOSIS — Z7189 Other specified counseling: Secondary | ICD-10-CM | POA: Diagnosis not present

## 2019-12-14 NOTE — Progress Notes (Signed)
OV 01/17/2016  Chief Complaint  Patient presents with  . Follow-up    Pt states her breathing is at baseline. Pt states she feels the symbicort makes her wheeze and then feels she needs to use her albuterol hfa.    Follow-up Gold stage IV COPD-not on home oxygen but on triple inhaler therapy: She had an exacerbation July 2016 around the time I started her on triple inhaler therapy. That was treated with prednisone. Then in January 2017 at the time of last visit had another exacerbation treated with anabiotic's and prednisone. She tells me that several weeks after that she saw primary care physician and had to get repeat antibiotics or prednisone of which she is unsure. She currently feels stable. COPD cat score is 26 and reflects a stable baseline COPD which is improved compared to a year ago. Nevertheless she tells me that Symbicort makes a wheeze  Smoking: Continues to smoke. Struggles to quit  New issue is leg cramps: Ever since she started inhalers she's having leg cramps. Self treated with over-the-counter potassium which has helped. When she does not take potassium leg cramps get worse. She wants me to check her chemistry levels.  Lung cancer screening: She is eligible for this but she will need a referral to the cancer screening program.       OV 07/24/2016  Chief Complaint  Patient presents with  . Follow-up    Pt states her SOB is unchanged since last OV. Pt c/o prod cough with white mucus - at baseline. Pt denies CP/tightness and f/c/s.       Follow-up Gold stage IV COPD. Not on home oxygen but on triple  inhaler therapy. Since last visit she continues to smoke. She is unable to pulmonary rehabilitation because of Medicaidlack of coverage. Her triple inhaler therapy is now Incruse and symbicort. She felt Spiriva was causing of thrush. She does not want her to [facility] or [facility] because these were ineffective according to subjective opinion. Current triple inhaler therapy  works well for her. She will have a flu shot. There are no new issues.    OV 11/01/2016  Chief Complaint  Patient presents with  . Acute Visit    Pt c/o increase in SOB an prod cough with clear mucus and body aches, fever. Pt states she was given doxy. Pt states she has not taken her temp but she has had chills.    Acute visit for Gold stage IV COPD patient was not on oxygen therapy but on triple inhaler therapy and has continued to smoke.  Approximately one half weeks ago she developed a head cold. She saw her primary care physician and was given one half days of amoxicillin but this did not help. Subsequently on 10/25/2016 because of unimproved symptoms of cough headache or wheezing and subjective feverishness she saw primary care physician again and was prescribed doxycycline. She does not recollect chest x-ray at any time. Of note she is up-to-date with her flu shot. She does not think she has a flu but she does have fatigue. Therefore she decided to come in acutely today and upon arrival was hypoxemic on room air requiring. As of oxygen. This is a new finding for her. She is able to talk sentences but does not desire admission to the hospital. This no edema or hemoptysis or syncope   OV 02/03/2018  Chief Complaint  Patient presents with  . Follow-up    Pt states she has been doing  good since last visit. Pt states she did quit smoking cigs but is doing the vape. Denies any complaints of cough, sob, or CP.    Follow-up Gold stage IV COPD not on oxygen therapy but on triple inhaler therapy and heavy smoker  Last seen February 2018 which is 14 months ago.  Since then overall COPD stable without any exacerbation.  She continues with Incruse and Symbicort.  She feels Symbicort works better for her.  In the past she has tried Firefighter and this is not helped.  She had questions about Trelegy inhaler but after talking to her that it was Incruse and Breo combination she does not want to do it.  Moreover  she has not had recurrent exacerbations.  Her COPD CAT score is 24 and slightly better than her baseline from 2 years ago.  Last CT scan of the chest was in 2016 without any evidence of lung cancer.  In talking to her she smoked 1 pack a day since age 87 through age 34.  Recently she has converted to vape in an effort to quit smoking but then she says she is liking vape and is worried that she might get addicted to it.  Review of her vaccines show that she has had Prevnar but developed an allergic reaction requiring prednisone with redness and itching in her left upper extremity.  There are no other new issues  OV 12/14/2019  Subjective:  Patient ID: Ulyses Southward, female , DOB: [birth year] , age 59 y.o. , MRN: [id number] , ADDRESS: [place] [id number]   12/14/2019 -   Chief Complaint  Patient presents with  . Follow-up    Last seen 02/03/18. Pt states she has been doing okay since last visit and denies any complaints.    - Follow-up Gold stage IV COPD not on oxygen therapy but on triple inhaler therapy   - Quit smoking 2019 but vaping   HPI MALAYSIA CRANCE 59 y.o. -    Gold stage IV COPD but not on oxygen: She continues on Symbicort and Incruse.  Not seen her in 2 years.  Overall she is stable.  COPD CAT score shows stability.  She has been social distancing and keeping herself as safe as possible  Smoking: She quit tobacco in 2019.  She switched to vaping in an effort to quit tobacco smoking.  However she has not been able to come off vaping.  She understands the need.  She says that she slowly is working her way into quitting vaping  New issue: Vaccine counseling.  She is keen on getting the COVID-19 vaccine.  She initially asked questions about how to get it.  She has had difficulty reaching vaccination website for Covid.  Then upon review of the chart I noticed that in 2019 she indicated to me she had allergic reaction in her left upper extremity after getting  Prevnar.  She did get prednisone and get better.  Otherwise she has had other vaccines without any problems.  She does not have any allergies otherwise.  She tells me that she is very keen on getting the COVID-19 vaccine.  She tells me that if Moderna vaccine is the only choice [slightly higher side effect of arm swelling with Moderna) then she will take it.  She prefers Production designer, theatre/television/film due to higher efficacy.  I have advised her that [place] would be #1 choice.  Followed by Toma Aran or Moderna based on her analysis of  risk benefit.  CAT Score 12/14/2019 12/10/2018 02/03/2018  Total CAT Score 22 22 24      CAT COPD Symptom & Quality of Life Score (GSK trademark) 0 is no burden. 5 is highest burden 01/17/2016  02/03/2018   Never Cough -> Cough all the time 3 2  No phlegm in chest -> Chest is full of phlegm 3 3  No chest tightness -> Chest feels very tight 2 2  No dyspnea for 1 flight stairs/hill -> Very dyspneic for 1 flight of stairs 4 5  No limitations for ADL at home -> Very limited with ADL at home 4 4  Confident leaving home -> Not at all confident leaving home 3 3  Sleep soundly -> Do not sleep soundly because of lung condition 3 0  Lots of Energy -> No energy at all 4 5  TOTAL Score (max 40)  26 24    Simple office walk 185 feet x  3 laps goal with forehead probe 12/14/2019   O2 used ra  Number laps completed 3  Comments about pace slow  Resting Pulse Ox/HR 97% and 90/min  Final Pulse Ox/HR 92% and 113/min  Desaturated </= 88% no  Desaturated <= 3% points Yes, 5 poin  Got Tachycardic >/= 90/min yes  Symptoms at end of test Mild dyspnea  Miscellaneous comments x      IMPRESSION: 1. Lung-RADS 2, benign appearance or behavior. Continue annual screening with low-dose chest CT without contrast in 12 months. 2.  Emphysema (ICD10-J43.9). 3.  Aortic atherosclerosis (ICD10-170.0).   Electronically Signed   By: 12/16/2019 M.D.   On: 07/01/2019 08:35   ROS - per  HPI     has a past medical history of Anxiety, Asthmatic bronchitis, Chronic pain, COPD (chronic obstructive pulmonary disease) (HCC), and Hypertension.   reports that she quit smoking about 2 years ago. Her smoking use included cigarettes. She has a 46.00 pack-year smoking history. She has never used smokeless tobacco.  Past Surgical History:  Procedure Laterality Date  . ECTOPIC PREGNANCY SURGERY    . TUBAL LIGATION      No Known Allergies  Immunization History  Administered Date(s) Administered  . Influenza Split 08/01/2014, 07/27/2015  . Influenza,inj,Quad PF,6+ Mos 07/24/2016, 08/01/2017, 07/01/2018, 07/02/2019  . Pneumococcal Conjugate-13 04/08/2015    Family History  Problem Relation Age of Onset  . Asthma Daughter   . Asthma Daughter   . Skin cancer Father   . Alzheimer's disease Father      Current Outpatient Medications:  .  albuterol (PROAIR HFA) 108 (90 Base) MCG/ACT inhaler, INHALE 2 PUFFS EVERY 6HRS AS NEEDED FOR WHEEZING/SHORTNESS OF BREATH, Disp: 18 g, Rfl: 1 .  betamethasone dipropionate 0.05 % lotion, APPLY TO THE SCALP DAILY TAPER USE AS ABLE, Disp: , Rfl:  .  clonazePAM (KLONOPIN) 1 MG tablet, Take 1 tablet (1 mg total) by mouth 2 (two) times daily. (Patient taking differently: Take 1 mg by mouth 2 (two) times daily. 0.25 tablet twice daily. Per patient stated she is trying to slowly get off of the medication.), Disp: 10 tablet, Rfl: 0 .  cloNIDine (CATAPRES) 0.2 MG tablet, Take 0.2 mg by mouth at bedtime. , Disp: , Rfl:  .  HYDROcodone-acetaminophen (NORCO) 10-325 MG tablet, Take 1 tablet by mouth every 6 (six) hours as needed for moderate pain. , Disp: , Rfl:  .  INCRUSE ELLIPTA 62.5 MCG/INH AEPB, TAKE 1 PUFF BY MOUTH EVERY DAY, Disp: 90 each, Rfl: 1 .  irbesartan (AVAPRO) 150 MG tablet, , Disp: , Rfl: 3 .  MAGNESIUM PO, Take by mouth daily., Disp: , Rfl:  .  Multiple Vitamins-Minerals (MULTI FOR HER 50+ PO), Take 1 tablet by mouth daily., Disp: , Rfl:   .  Potassium (POTASSIMIN PO), Take by mouth., Disp: , Rfl:  .  SYMBICORT 80-4.5 MCG/ACT inhaler, INHALE 2 PUFFS INTO THE LUNGS 2 (TWO) TIMES DAILY FOR 30 DAYS., Disp: 30.6 Inhaler, Rfl: 1 .  Vitamin D, Ergocalciferol, (DRISDOL) 1.25 MG (50000 UNIT) CAPS capsule, Take 50,000 Units by mouth once a week., Disp: , Rfl:  No current facility-administered medications for this visit.  Facility-Administered Medications Ordered in Other Visits:  .  albuterol (PROVENTIL) (2.5 MG/3ML) 0.083% nebulizer solution 2.5 mg, 2.5 mg, Nebulization, Once, Kalman Shan, MD      Objective:   Vitals:   12/14/19 1208  BP: 108/64  Pulse: 98  SpO2: 99%  Weight: 101 lb 9.6 oz (46.1 kg)  Height: 4\' 11"  (1.499 m)    Estimated body mass index is 20.52 kg/m as calculated from the following:   Height as of this encounter: 4\' 11"  (1.499 m).   Weight as of this encounter: 101 lb 9.6 oz (46.1 kg).  @WEIGHTCHANGE @    12/14/19 1208  Weight: 101 lb 9.6 oz (46.1 kg)     Physical Exam Alert and oriented x3.  Speech is normal.  Clear to auscultation bilaterally.  Slight barrel chest no wheeze overall diminished air entry with expiration prolonged.  BMI 20.  Abdomen soft normal heart sounds.  Thin female no thrush.  Nonfocal exam.         Assessment:       ICD-10-CM   1. COPD, very severe (HCC)  J44.9   2. Former smoker  Z87.891   3. Screening for respiratory organ cancer  Z12.2   4. Vaccine counseling  Z71.89        Plan:     Patient Instructions  COPD, very severe (HCC) - MM phenotype  - stable disease  PLAN  - continue symbicort and incruse scheduled as before  - use albuterol as needed  Vaccine counseling - In 2019 you had allergic reaction to prevnar - with arml swelling needing prednisone  - CMA to document it in allergy if you still confirm this to be true  PLAN - Still recommend covid vaccine    - but expect side effects   - avoid Moderna vaccine (some reports of  increased arm swelling with Moderna)   - ensure 30 min or longer observation period after vaccine   - let them know about what happened with prevnar  Smoking  -glad you quit tobacco and now on vape in an efforrt to quit completely - glad you are thinking about quitting vaping  Screening for respiratory organ cancer - no cancer Sept 2020  Plan - repeat low dose ct sept 2021 - nov 2021  Folllowup 6-9 months or sooner if needed; CAT score at followup     SIGNATURE    Dr. 11-26-1992, M.D., F.C.C.P,  Pulmonary and Critical Care Medicine Staff Physician, [facility] Center Director - Interstitial Lung Disease  Program  Pulmonary Fibrosis [facility] at [facility], [facility], [facility]  Pager: [phone], If no answer or between  15:00h - 7:00h: call [phone] Telephone: [phone]  12:46 PM 12/14/2019

## 2019-12-14 NOTE — Patient Instructions (Addendum)
COPD, very severe (HCC) - MM phenotype  - stable disease  PLAN  - continue symbicort and incruse scheduled as before  - use albuterol as needed - discuss rehab at followup  Vaccine counseling - In 2019 you had allergic reaction to prevnar - with arml swelling needing prednisone  - CMA to document it in allergy if you still confirm this to be true  PLAN - Still recommend covid vaccine    - but expect side effects   - avoid Moderna vaccine if at all possible but if no other choice then you can take a calculated risk with increased arm swelling with Moderna)   - ensure 30 min or longer observation period after vaccine   - let them know about what happened with prevnar  Smoking  -glad you quit tobacco and now on vape in an efforrt to quit completely - glad you are thinking about quitting vaping  Screening for respiratory organ cancer - no cancer Sept 2020  Plan - repeat low dose ct sept 2021 - nov 2021  Folllowup 6-9 months or sooner if needed; CAT score at followup

## 2020-03-09 ENCOUNTER — Other Ambulatory Visit: Payer: Self-pay | Admitting: Internal Medicine

## 2020-03-09 DIAGNOSIS — J449 Chronic obstructive pulmonary disease, unspecified: Secondary | ICD-10-CM

## 2020-03-28 ENCOUNTER — Other Ambulatory Visit: Payer: Self-pay | Admitting: Internal Medicine

## 2020-07-04 ENCOUNTER — Encounter: Payer: Self-pay | Admitting: Acute Care

## 2020-07-13 ENCOUNTER — Telehealth: Payer: Self-pay | Admitting: Acute Care

## 2020-07-13 DIAGNOSIS — Z87891 Personal history of nicotine dependence: Secondary | ICD-10-CM

## 2020-07-13 NOTE — Telephone Encounter (Signed)
Sonia Bush her last CT was done 06/30/19 and [place] [facility] and she has Medicare and Medicaid. We will need new Ct order because she will have to have CT done at [facility] this year

## 2020-07-13 NOTE — Telephone Encounter (Signed)
Anita, can you contact pt to schedule f/u low dose ct?  

## 2020-07-14 NOTE — Telephone Encounter (Signed)
New Chest CT order placed

## 2020-07-14 NOTE — Telephone Encounter (Signed)
I have spoke with Sonia Bush and her LCS CT has been scheduled on 08/05/2020 @ 2:50pm and she is aware that it is scheduled at [facility] [address] [place]

## 2020-07-19 ENCOUNTER — Other Ambulatory Visit: Payer: Self-pay | Admitting: Internal Medicine

## 2020-08-05 ENCOUNTER — Inpatient Hospital Stay: Admission: RE | Admit: 2020-08-05 | Payer: Medicare Other | Source: Ambulatory Visit

## 2020-09-02 ENCOUNTER — Other Ambulatory Visit: Payer: Self-pay | Admitting: Internal Medicine

## 2020-09-02 DIAGNOSIS — J449 Chronic obstructive pulmonary disease, unspecified: Secondary | ICD-10-CM

## 2020-11-11 ENCOUNTER — Telehealth: Payer: Self-pay | Admitting: Internal Medicine

## 2020-11-11 MED ORDER — DOXYCYCLINE HYCLATE 100 MG PO TABS: 100.0000 mg | ORAL_TABLET | Freq: Two times a day (BID) | ORAL | 0 refills | Status: AC

## 2020-11-11 MED ORDER — PREDNISONE 10 MG PO TABS: ORAL_TABLET | ORAL | 0 refills | Status: AC

## 2020-11-11 NOTE — Telephone Encounter (Signed)
The home Covid test can be 10% false negative.  She should go get a Covid PCR test.  In case it is positive she might qualify for antiviral also monoclonal antibody and therefore I think she should take the effort to get a PCR test although delays with the results can interfere with her ability to get those medications.  Nevertheless the idea to get a PCR test is that if she is positive there is expanded the therapeutic options

## 2020-11-11 NOTE — Telephone Encounter (Signed)
Patient stated that she would call back, as she was in the process of taking a home covid test.

## 2020-11-11 NOTE — Telephone Encounter (Signed)
Likely aecopd but cause could be covid  Plan  - go get covid tested  - Please take prednisone 40 mg x1 day, then 30 mg x1 day, then 20 mg x1 day, then 10 mg x1 day, and then 5 mg x1 day and stop - Take doxycycline 100mg  po twice daily x 5 days; take after meals and avoid sunlight    Allergies  Allergen Reactions  . Prevnar 13 [Pneumococcal 13-Val Conj Vacc]     Arm swelling     Immunization History  Administered Date(s) Administered  . Influenza Split 08/01/2014, 07/27/2015  . Influenza,inj,Quad PF,6+ Mos 07/24/2016, 08/01/2017, 07/01/2018, 07/02/2019  . Pneumococcal Conjugate-13 04/08/2015      has a past medical history of Anxiety, Asthmatic bronchitis, Chronic pain, COPD (chronic obstructive pulmonary disease) (HCC), and Hypertension.   has a past surgical history that includes Ectopic pregnancy surgery and Tubal ligation.

## 2020-11-11 NOTE — Telephone Encounter (Signed)
Call returned to patient, confirmed DOB. Made aware of MR recommendations. Patient reports she used her at home covid test and it came back negative. Will notify MR. Confirmed pharmacy. Medication sent it.   Nothing further needed at this time. Will route message to MR as FYI.

## 2020-11-11 NOTE — Telephone Encounter (Signed)
Call returned to patient, confirmed DOB. Reports having intermittent fevers ranging from 100 to 100.4. Reports increased SOB. Denies active chest pain. Denies cough. Reports her SOB is so bad she can barely take a shower. Reports she is fully vaccinated and has had her booster shot. She has already had her flu shot as well. Reports using her incruse and symbicort daily and her albuterol prn. She has not used it today. Requesting recommendations.   MR please advise.   Thanks

## 2020-11-11 NOTE — Telephone Encounter (Signed)
Called and spoke with pt letting her know that MR does still want her to get PCR covid test done even though her home covid test came back negative and she verbalized understanding. Stated to pt once she had the results to call us to let us know what they are and she verbalized understanding. Stated to her also that we were still going to send Rx for prednisone and abx to pharmacy for her. Nothing further needed.

## 2020-11-16 ENCOUNTER — Other Ambulatory Visit: Payer: Self-pay | Admitting: Internal Medicine

## 2020-11-21 ENCOUNTER — Telehealth: Payer: Self-pay | Admitting: Primary Care

## 2020-11-21 ENCOUNTER — Telehealth: Payer: Self-pay | Admitting: Internal Medicine

## 2020-11-21 ENCOUNTER — Ambulatory Visit (INDEPENDENT_AMBULATORY_CARE_PROVIDER_SITE_OTHER): Payer: Medicare Other | Admitting: Primary Care

## 2020-11-21 ENCOUNTER — Encounter: Payer: Self-pay | Admitting: Primary Care

## 2020-11-21 DIAGNOSIS — J441 Chronic obstructive pulmonary disease with (acute) exacerbation: Secondary | ICD-10-CM

## 2020-11-21 DIAGNOSIS — J449 Chronic obstructive pulmonary disease, unspecified: Secondary | ICD-10-CM

## 2020-11-21 MED ORDER — PREDNISONE 10 MG PO TABS: ORAL_TABLET | ORAL | 0 refills | Status: DC

## 2020-11-21 MED ORDER — BUDESONIDE-FORMOTEROL FUMARATE 160-4.5 MCG/ACT IN AERO: 2.0000 | INHALATION_SPRAY | Freq: Two times a day (BID) | RESPIRATORY_TRACT | 0 refills | Status: DC

## 2020-11-21 MED ORDER — LEVOFLOXACIN 500 MG PO TABS: 500.0000 mg | ORAL_TABLET | Freq: Every day | ORAL | 0 refills | Status: DC

## 2020-11-21 NOTE — Progress Notes (Signed)
Virtual Visit via Telephone Note  I connected with Sonia Bush on 11/21/20 at  2:00 PM EST by telephone and verified that I am speaking with the correct person using two identifiers.  Location: Patient: Home Provider: Office   I discussed the limitations, risks, security and privacy concerns of performing an evaluation and management service by telephone and the availability of in person appointments. I also discussed with the patient that there may be a patient responsible charge related to this service. The patient expressed understanding and agreed to proceed.   History of Present Illness: 60 year old female, former smoker quit in 2019 (46 pack year hx). PMH significant for very severe COPD, panlobular emphysema, chronic respiratory failure. Patient of Sonia Bush, last see on 12/14/19. Maintained on Symbicort 80 and Incruse Ellipta.    11/21/2020 Patient contacted today for acute visit. She originally called our office on 11/11/20 with reports of intermittent low grade fever and shortness of breath. She was prescribed course of Doxycyline and prednisone taper for suspected COPD exacerbations. She was advised to get covid tested. She took a home tested that came back negative, Sonia Bush recommended PCR testing. She reports that once she completed abx and prednisone her temperature came back.   Normally her breathing is labored with moderate exertion. She is currently experiencing a lot of wheezing. She has a chronic dry cough. Denies purulent mucus. Associated body aches, chills, dry mouth. She has not checked her temperature today. Last night her temperature was 100.4. She is on chronic pain medication which does have tylenol in it. She is scheduled for PCR covid testing today at 4pm. Dermatologist has her on 50mg  doxycycline once a day for the last several months. She missed LDCT this past September 2021 d/t covid precautions. Denies GI or urinary symptoms.      Observations/Objective:  - Able to speak in full sentences; no overt shortness of breath, wheezing or cough   Assessment and Plan:  COPD exacerbation: - Dry cough with associated low grade fever, chills and wheezing. Concern for prolonged AECOPD vs CAP, no improvement with doxycycline course - Rx Levaquin 500mg  qd x 7 days and prednisone taper (40mg  x 2 days, 20mg  x 2 days, 10mg  x 2 days) - Change Symbicort 80 to October 2021; continue Incruse Ellipta  - Needs PCR covid testing and checking CXR   Follow Up Instructions:  - After covid testing/CXR and due for regular follow-up with Dr.    I discussed the assessment and treatment plan with the patient. The patient was provided an opportunity to ask questions and all were answered. The patient agreed with the plan and demonstrated an understanding of the instructions.   The patient was advised to call back or seek an in-person evaluation if the symptoms worsen or if the condition fails to improve as anticipated.  I provided 22 minutes of non-face-to-face time during this encounter.   , NP

## 2020-11-21 NOTE — Telephone Encounter (Signed)
Will forward to [place] to get CT rescheduled.

## 2020-11-21 NOTE — Telephone Encounter (Signed)
Patient missed LDCT this past September 2021 d/t covid precautions. Can we reschedule. She is acutely sick right now so would wait 2-4 weeks.

## 2020-11-21 NOTE — Patient Instructions (Signed)
Levaquin and prednisone taper as directed CXR on Wednesday if covid testing is negative We need to reschedule low dose CT chest  Needs follow-up with Dr. Marchelle Gearing

## 2020-11-21 NOTE — Telephone Encounter (Signed)
Called and spoke with pt who stated she finished meds that were prescribed by MR but once meds were finished, temp came back.  Pt is recommending for more meds to be called in.  Asked pt if she ever got a covid PCR test done as recommended and she stated that she had not. Stated to pt that we needed her to get that covid test done due to her symptoms. Also stated to her that we would schedule a televisit for further recommendations and she verbalized understanding. televisit scheduled for pt today 2/21 at 2pm with Beth. Nothing further needed.

## 2020-11-22 NOTE — Telephone Encounter (Signed)
I spoke with Mrs. Tagle today and she explained that she had a televisit yesterday with Waynetta Sandy because she has been having some health issues.  She is wanting to wait until they have figured out what is going on with her lungs before rescheduling the LCS CT. She stated that when she got better she would call me back to schedule the LCS CT

## 2020-11-24 ENCOUNTER — Telehealth: Payer: Self-pay | Admitting: Internal Medicine

## 2020-11-24 NOTE — Telephone Encounter (Signed)
Called and spoke with patient who states that she did a PCR covid test on Monday and it was negative.She states she is on 3rd day of antibiotics, no fever, but still having difficulty breathing. Still has 3 days to go with antibiotics, feels a little hoarse and has some congestion and sometimes has productive cough with some yellow. Patient wants to know what to do or if she needs to make an OV  Please advise

## 2020-11-24 NOTE — Telephone Encounter (Signed)
Called and spoke with pt letting her know the info stated by MR and she verbalized understanding. An appt has been scheduled for pt tomorrow 2/25 with [facility]. Nothing further needed.

## 2020-11-24 NOTE — Telephone Encounter (Signed)
We can reextend the prednisone taper and see what happens - Please take prednisone 40 mg x1 day, then 30 mg x1 day, then 20 mg x1 day, then 10 mg x1 day, and then 5 mg x1 day and stop  Give follow-up to see nurse practitioner  If worse go to the ER  Beyond this hard to treat on the phone

## 2020-11-25 ENCOUNTER — Other Ambulatory Visit: Payer: Self-pay

## 2020-11-25 ENCOUNTER — Encounter: Payer: Self-pay | Admitting: Primary Care

## 2020-11-25 ENCOUNTER — Ambulatory Visit (INDEPENDENT_AMBULATORY_CARE_PROVIDER_SITE_OTHER): Payer: Medicare Other

## 2020-11-25 ENCOUNTER — Ambulatory Visit (INDEPENDENT_AMBULATORY_CARE_PROVIDER_SITE_OTHER): Payer: Medicare Other | Admitting: Primary Care

## 2020-11-25 VITALS — BP 110/64 | HR 115 | Temp 98.0°F | Ht 59.0 in | Wt 100.2 lb

## 2020-11-25 DIAGNOSIS — R0600 Dyspnea, unspecified: Secondary | ICD-10-CM | POA: Diagnosis not present

## 2020-11-25 DIAGNOSIS — R0689 Other abnormalities of breathing: Secondary | ICD-10-CM

## 2020-11-25 DIAGNOSIS — J9611 Chronic respiratory failure with hypoxia: Secondary | ICD-10-CM | POA: Diagnosis not present

## 2020-11-25 DIAGNOSIS — J449 Chronic obstructive pulmonary disease, unspecified: Secondary | ICD-10-CM | POA: Diagnosis not present

## 2020-11-25 DIAGNOSIS — J441 Chronic obstructive pulmonary disease with (acute) exacerbation: Secondary | ICD-10-CM | POA: Diagnosis not present

## 2020-11-25 MED ORDER — BREZTRI AEROSPHERE 160-9-4.8 MCG/ACT IN AERO: 2.0000 | INHALATION_SPRAY | Freq: Two times a day (BID) | RESPIRATORY_TRACT | 0 refills | Status: DC

## 2020-11-25 MED ORDER — METHYLPREDNISOLONE ACETATE 80 MG/ML IJ SUSP
60.0000 mg | Freq: Once | INTRAMUSCULAR | Status: AC
  Administered 2020-11-25: 60 mg via INTRAMUSCULAR

## 2020-11-25 NOTE — Patient Instructions (Addendum)
CXR showed hyperinflation and bronchitic changes  Office treatment: - Depo-medrol 60mg  IM x1  - Check POC   Recommendations: - Stop Symbicort and Incruse - Start - take two puffs morning and evening (use aerochamber) - Use 2L oxygen continuously to keep O2 >88-90%   - Continue Levaquin for 2 more days (discuss further abx with MR on Monday) - Stay on 20mg  prednisone daily until follow-up   Orders: - Needs labs either this weekend or Monday (D-dimer, BNP, CBC with diff, BMET) - Please provide patient with Aerochamber if she does not have one   Follow-up: - Monday televisit with MR

## 2020-11-25 NOTE — Assessment & Plan Note (Signed)
-   Due to severe COPD and emphysema - Needs new order for oxygen 2L/min continuously

## 2020-11-25 NOTE — Progress Notes (Signed)
@Patient  ID: , female    DOB: Dec 26, 1960, 60 y.o.   MRN: [id number]  No chief complaint on file.   Referring provider: [id number], MD  HPI: 60 year old female, former smoker quit in 2019 (46 pack year hx). PMH significant for very severe COPD, panlobular emphysema, chronic respiratory failure. Patient of Dr. 2020, last see on 12/14/19. Maintained on Symbicort 80 and Incruse Ellipta.    Previous [facility] pulmonary encounters:  11/21/2020 Patient contacted today for acute visit. She originally called our office on 11/11/20 with reports of intermittent low grade fever and shortness of breath. She was prescribed course of Doxycyline and prednisone taper for suspected COPD exacerbations. She was advised to get covid tested. She took a home tested that came back negative, Dr. 01/09/21 recommended PCR testing. She reports that once she completed abx and prednisone her temperature came back.   Normally her breathing is labored with moderate exertion. She is currently experiencing a lot of wheezing. She has a chronic dry cough. Denies purulent mucus. Associated body aches, chills, dry mouth. She has not checked her temperature today. Last night her temperature was 100.4. She is on chronic pain medication which does have tylenol in it. She is scheduled for PCR covid testing today at 4pm. Dermatologist has her on 50mg  doxycycline once a day for the last several months. She missed LDCT this past September 2021 d/t covid precautions. Denies GI or urinary symptoms.   COPD exacerbation: - Dry cough with associated low grade fever, chills and wheezing. Concern for prolonged AECOPD vs CAP, no improvement with doxycycline course - Rx Levaquin 500mg  qd x 7 days and prednisone taper (40mg  x 2 days, 20mg  x 2 days, 10mg  x 2 days) - Change Symbicort 80 to ; continue Incruse Ellipta  - Needs PCR covid testing and checking CXR   11/25/2020 - Interim hx  Patient presents today for acute visit/  4 day follow-up COPD exacerbations. She is on day #4 of Levaquin course. No difference on increased Symbicort dose. She is compliant with Incruse. Prednisone taper was extended by Dr. . She had PCR covid test at [facility] on 11/21/20 that was negative. Her breathing has improved ever so slightly. She has a dry cough and wheezing. She was previously on oxygen but never got service. She has not had a fever since starting antibiotics. She is still vaping. She report that she has mold in a part of her house that is not used much. Denies GI symptoms, urinary symptoms. LDCT on 06/04/19 showed severe centrilobular emphysema. Mild biapical pleuroparenchymal scarring. Pulmonary nodules measure 5.9 mm or less in size.  Allergies  Allergen Reactions  . Prevnar 13 [Pneumococcal 13-Val Conj Vacc]     Arm swelling    Immunization History  Administered Date(s) Administered  . Fluad Quad(high Dose 65+) 07/10/2020  . Influenza Split 08/01/2014, 07/27/2015  . Influenza,inj,Quad PF,6+ Mos 07/24/2016, 08/01/2017, 07/01/2018, 07/02/2019  . PFIZER(Purple Top)SARS-COV-2 Vaccination 12/17/2019, 01/11/2020, 09/07/2020  . Pneumococcal Conjugate-13 04/08/2015    Past Medical History:  Diagnosis Date  . Anxiety   . Asthmatic bronchitis   . Chronic pain   . COPD (chronic obstructive pulmonary disease) (HCC)   . Hypertension     Tobacco History: Social History   Tobacco Use  Smoking Status Former Smoker  . Packs/day: 1.50  . Years: 46.00  . Pack years: 69.00  . Types: Cigarettes  . Start date: 96  . Quit date: 11/01/2017  . Years since quitting: 3.0  Smokeless Tobacco Never Used   Counseling given: Not Answered   Outpatient Medications Prior to Visit  Medication Sig Dispense Refill  . albuterol (VENTOLIN HFA) 108 (90 Base) MCG/ACT inhaler INHALE 2 PUFFS EVERY 6HRS AS NEEDED FOR WHEEZING/SHORTNESS OF BREATH 8.5 each 0  . betamethasone dipropionate 0.05 % lotion APPLY TO THE SCALP DAILY TAPER USE AS  ABLE    . budesonide-formoterol (SYMBICORT) 160-4.5 MCG/ACT inhaler Inhale 2 puffs into the lungs in the morning and at bedtime. 1 each 0  . clonazePAM (KLONOPIN) 1 MG tablet Take 1 tablet (1 mg total) by mouth 2 (two) times daily. (Patient taking differently: Take 1 mg by mouth 2 (two) times daily. 0.25 tablet twice daily. Per patient stated she is trying to slowly get off of the medication.) 10 tablet 0  . cloNIDine (CATAPRES) 0.2 MG tablet Take 0.2 mg by mouth at bedtime.     Marland Kitchen doxycycline (VIBRAMYCIN) 50 MG capsule Take 50 mg by mouth daily. Per dermatology    . HYDROcodone-acetaminophen (NORCO) 10-325 MG tablet Take 1 tablet by mouth every 6 (six) hours as needed for moderate pain.     . INCRUSE ELLIPTA 62.5 MCG/INH AEPB INHALE 1 PUFF BY MOUTH EVERY DAY 90 each 1  . irbesartan (AVAPRO) 150 MG tablet   3  . levofloxacin (LEVAQUIN) 500 MG tablet Take 1 tablet (500 mg total) by mouth daily. 7 tablet 0  . MAGNESIUM PO Take by mouth daily.    . Multiple Vitamins-Minerals (MULTI FOR HER 50+ PO) Take 1 tablet by mouth daily.    . Potassium (POTASSIMIN PO) Take by mouth.    . predniSONE (DELTASONE) 10 MG tablet Take 4 tabs po daily x 2 days; then 2 tabs for 2 days; then 1 tab for 2 days 14 tablet 0  . Vitamin D, Ergocalciferol, (DRISDOL) 1.25 MG (50000 UNIT) CAPS capsule Take 50,000 Units by mouth once a week.     Facility-Administered Medications Prior to Visit  Medication Dose Route Frequency Provider Last Rate Last Admin  . albuterol (PROVENTIL) (2.5 MG/3ML) 0.083% nebulizer solution 2.5 mg  2.5 mg Nebulization Once Kalman Shan, MD       Review of Systems  Review of Systems  Constitutional: Negative for fever and unexpected weight change.  Respiratory: Positive for cough, shortness of breath and wheezing.    Physical Exam  BP 110/64 (BP Location: Right Arm, Patient Position: Sitting, Cuff Size: Normal)   Pulse (!) 115   Temp 98 F (36.7 C) (Temporal)   Ht 4\' 11"  (1.499 m)   Wt  100 lb 3.2 oz (45.5 kg)   SpO2 97% Comment: 2L  BMI 20.24 kg/m  Physical Exam Constitutional:      Appearance: Normal appearance.  HENT:     Head: Normocephalic and atraumatic.     Mouth/Throat:     Comments: Deferred d/t masking Cardiovascular:     Rate and Rhythm: Regular rhythm. Tachycardia present.  Pulmonary:     Effort: Pulmonary effort is normal.     Breath sounds: Wheezing present. No rhonchi.     Comments: Able to speak in full sentences without significant shortness of breath Musculoskeletal:     Comments:  In WC; able to walk short distances  Skin:    General: Skin is warm and dry.  Neurological:     General: No focal deficit present.     Mental Status: She is alert and oriented to person, place, and time. Mental status is at baseline.  Psychiatric:  Mood and Affect: Mood normal.        Behavior: Behavior normal.        Thought Content: Thought content normal.        Judgment: Judgment normal.      Lab Results:  CBC    Component Value Date/Time   WBC 14.6 (H) 11/14/2011 0225   RBC 4.21 11/14/2011 0225   HGB 14.7 11/14/2011 0225   HCT 41.6 11/14/2011 0225   PLT 254 11/14/2011 0225   MCV 98.8 11/14/2011 0225   MCH 34.9 (H) 11/14/2011 0225   MCHC 35.3 11/14/2011 0225   RDW 11.6 11/14/2011 0225   LYMPHSABS 1.3 11/14/2011 0225   MONOABS 0.9 11/14/2011 0225   EOSABS 0.2 11/14/2011 0225   BASOSABS 0.0 11/14/2011 0225    BMET    Component Value Date/Time   NA 136 01/17/2016 1605   K 3.5 01/17/2016 1605   CL 95 (L) 01/17/2016 1605   CO2 34 (H) 01/17/2016 1605   GLUCOSE 156 (H) 01/17/2016 1605   BUN 23 01/17/2016 1605   CREATININE 0.83 01/17/2016 1605   CALCIUM 10.4 01/17/2016 1605   GFRNONAA >90 11/14/2011 0225   GFRAA >90 11/14/2011 0225    BNP No results found for: BNP  ProBNP No results found for: PROBNP  Imaging: DG Chest 2 View  Result Date: 11/25/2020 CLINICAL DATA:  Cough and low-grade fever.  History of COPD. EXAM: CHEST - 2  VIEW COMPARISON:  12/06/2016 FINDINGS: Lungs are hyperinflated. There is perihilar peribronchial thickening. There are no focal consolidations or pleural effusions. No pulmonary edema. IMPRESSION: Hyperinflation and bronchitic changes. Electronically Signed   By: Norva Pavlov M.D.   On: 11/25/2020 16:38     Assessment & Plan:   COPD, very severe Shortness of breath over x 2 weeks with associated fevers. No significant improvement in breathing with antibiotics (doxycycline and Levaquin) or oral prednisone. She is no longer having fevers. She had some wheezing on exam. O2 was 85% on RA, improved on 2L. She was previously on oxygen but never got service. CXR today did not show an acute process; hyperinflation and bronchitic changes. She has severe emphysema on LDCT imaging from 2020. She received 60mg  Depo-medrol IM x 1 today in office. Recommend trial Breztri two puff twice daily (sample given). She will hold Symbicort and Incruse. Needs labs (d-dimer/bnp) unfortunately given late afternoon appointment she will need to get done over the weekend or Monday. FU Monday with Dr. Saturday. Advised ED if symptoms do not improve or worsen.   Recommendations: - Stop Symbicort and Incruse - Start Breztri two puffs morning and evening (use aerochamber) - Use 2L oxygen continuously to keep O2 >88-90%   - Continue Levaquin for 2 more days (discuss further abx with MR on Monday) - Stay on 20mg  prednisone daily until follow-up   Orders: - Needs labs either this weekend or Monday (D-dimer, BNP, CBC with diff, BMET) - Please provide patient with Aerochamber   Follow-up: - Monday televisit with MR to see how she is doing and next steps. May need advance imaging, concern for potential ILD d/t vaping and mold exposure.  Chronic respiratory failure (HCC) - Due to severe COPD and emphysema - Needs new order for oxygen 2L/min continuously      Monday, NP 11/25/2020

## 2020-11-25 NOTE — Assessment & Plan Note (Addendum)
Shortness of breath over x 2 weeks with associated fevers. No significant improvement in breathing with antibiotics (doxycycline and Levaquin) or oral prednisone. She is no longer having fevers. She had some wheezing on exam. O2 was 85% on RA, improved on 2L. She was previously on oxygen but never got service. CXR today did not show an acute process; hyperinflation and bronchitic changes. She has severe emphysema on LDCT imaging from 2020. She received 60mg  Depo-medrol IM x 1 today in office. Recommend trial Breztri two puff twice daily (sample given). She will hold Symbicort and Incruse. Needs labs (d-dimer/bnp) unfortunately given late afternoon appointment she will need to get done over the weekend or Monday. FU Monday with Dr. Wednesday. Advised ED if symptoms do not improve or worsen.   Recommendations: - Stop Symbicort and Incruse - Start Breztri two puffs morning and evening (use aerochamber) - Use 2L oxygen continuously to keep O2 >88-90%   - Continue Levaquin for 2 more days (discuss further abx with MR on Monday) - Stay on 20mg  prednisone daily until follow-up   Orders: - Needs labs either this weekend or Monday (D-dimer, BNP, CBC with diff, BMET) - Please provide patient with Aerochamber   Follow-up: - Monday televisit with MR to see how she is doing and next steps. May need advance imaging, concern for potential ILD d/t vaping and mold exposure.

## 2020-11-28 ENCOUNTER — Ambulatory Visit (INDEPENDENT_AMBULATORY_CARE_PROVIDER_SITE_OTHER): Payer: Medicare Other | Admitting: Internal Medicine

## 2020-11-28 ENCOUNTER — Other Ambulatory Visit: Payer: Self-pay

## 2020-11-28 DIAGNOSIS — J441 Chronic obstructive pulmonary disease with (acute) exacerbation: Secondary | ICD-10-CM | POA: Diagnosis not present

## 2020-11-28 DIAGNOSIS — J449 Chronic obstructive pulmonary disease, unspecified: Secondary | ICD-10-CM

## 2020-11-28 DIAGNOSIS — Z9181 History of falling: Secondary | ICD-10-CM

## 2020-11-28 MED ORDER — PREDNISONE 10 MG PO TABS: ORAL_TABLET | ORAL | 0 refills | Status: AC

## 2020-11-28 NOTE — Progress Notes (Signed)
OV 01/17/2016  Chief Complaint  Patient presents with  . Follow-up    Pt states her breathing is at baseline. Pt states she feels the symbicort makes her wheeze and then feels she needs to use her albuterol hfa.    Follow-up Gold stage IV COPD-not on home oxygen but on triple inhaler therapy: She had an exacerbation July 2016 around the time I started her on triple inhaler therapy. That was treated with prednisone. Then in January 2017 at the time of last visit had another exacerbation treated with anabiotic's and prednisone. She tells me that several weeks after that she saw primary care physician and had to get repeat antibiotics or prednisone of which she is unsure. She currently feels stable. COPD cat score is 26 and reflects a stable baseline COPD which is improved compared to a year ago. Nevertheless she tells me that Symbicort makes a wheeze  Smoking: Continues to smoke. Struggles to quit  New issue is leg cramps: Ever since she started inhalers she's having leg cramps. Self treated with over-the-counter potassium which has helped. When she does not take potassium leg cramps get worse. She wants me to check her chemistry levels.  Lung cancer screening: She is eligible for this but she will need a referral to the cancer screening program.       OV 07/24/2016  Chief Complaint  Patient presents with  . Follow-up    Pt states her SOB is unchanged since last OV. Pt c/o prod cough with white mucus - at baseline. Pt denies CP/tightness and f/c/s.       Follow-up Gold stage IV COPD. Not on home oxygen but on triple  inhaler therapy. Since last visit she continues to smoke. She is unable to pulmonary rehabilitation because of Medicaidlack of coverage. Her triple inhaler therapy is now Incruse and symbicort. She felt Spiriva was causing of thrush. She does not [facility] because these were ineffective according to subjective opinion. Current triple inhaler therapy  works well for her. She will have a flu shot. There are no new issues.    OV 11/01/2016  Chief Complaint  Patient presents with  . Acute Visit    Pt c/o increase in SOB an prod cough with clear mucus and body aches, fever. Pt states she was given doxy. Pt states she has not taken her temp but she has had chills.    Acute visit for Gold stage IV COPD patient was not on oxygen therapy but on triple inhaler therapy and has continued to smoke.  Approximately one half weeks ago she developed a head cold. She saw her primary care physician and was given one half days of amoxicillin but this did not help. Subsequently on 10/25/2016 because of unimproved symptoms of cough headache or wheezing and subjective feverishness she saw primary care physician again and was prescribed doxycycline. She does not recollect chest x-ray at any time. Of note she is up-to-date with her flu shot. She does not think she has a flu but she does have fatigue. Therefore she decided to come in acutely today and upon arrival was hypoxemic on room air requiring. As of oxygen. This is a new finding for her. She is able to talk sentences but does not desire admission to the hospital. This no edema or hemoptysis or syncope   OV 02/03/2018  Chief Complaint  Patient presents with  . Follow-up    Pt states she has been doing good  since last visit. Pt states she did quit smoking cigs but is doing the vape. Denies any complaints of cough, sob, or CP.    Follow-up Gold stage IV COPD not on oxygen therapy but on triple inhaler therapy and heavy smoker  Last seen February 2018 which is 14 months ago.  Since then overall COPD stable without any exacerbation.  She continues with Incruse and Symbicort.  She feels Symbicort works better for her.  In the past she has tried Engineer, materials and this is not helped.  She had questions about Trelegy inhaler but after talking to her that it was Incruse and Breo combination she does not want to do it.  Moreover  she has not had recurrent exacerbations.  Her COPD CAT score is 24 and slightly better than her baseline from 2 years ago.  Last CT scan of the chest was in 2016 without any evidence of lung cancer.  In talking to her she smoked 1 pack a day since age 32 through age 65.  Recently she has converted to vape in an effort to quit smoking but then she says she is liking vape and is worried that she might get addicted to it.  Review of her vaccines show that she has had Prevnar but developed an allergic reaction requiring prednisone with redness and itching in her left upper extremity.  There are no other new issues  OV 12/14/2019  Subjective:  Patient ID: Sonia Bush, female , DOB: 05-25-1961 , age 60 y.o. , MRN: [id number] , ADDRESS: [address] [place] [address]   12/14/2019 -   Chief Complaint  Patient presents with  . Follow-up    Last seen 02/03/18. Pt states she has been doing okay since last visit and denies any complaints.    - Follow-up Gold stage IV COPD not on oxygen therapy but on triple inhaler therapy   - Quit smoking 2019 but vaping   HPI DEBORA STOCKDALE 60 y.o. -    Gold stage IV COPD but not on oxygen: She continues on Symbicort and Incruse.  Not seen her in 2 years.  Overall she is stable.  COPD CAT score shows stability.  She has been social distancing and keeping herself as safe as possible  Smoking: She quit tobacco in 2019.  She switched to vaping in an effort to quit tobacco smoking.  However she has not been able to come off vaping.  She understands the need.  She says that she slowly is working her way into quitting vaping  New issue: Vaccine counseling.  She is keen on getting the COVID-19 vaccine.  She initially asked questions about how to get it.  She has had difficulty reaching vaccination website for Covid.  Then upon review of the chart I noticed that in 2019 she indicated to me she had allergic reaction in her left upper extremity after getting  Prevnar.  She did get prednisone and get better.  Otherwise she has had other vaccines without any problems.  She does not have any allergies otherwise.  She tells me that she is very keen on getting the COVID-19 vaccine.  She tells me that if Moderna vaccine is the only choice [slightly higher side effect of arm swelling with Moderna) then she will take it.  She prefers Teacher, adult education due to higher efficacy.  I have advised her that Pfizer would be #1 choice.  Followed by Oletta Darter or Moderna based on her analysis of risk  benefit.  CAT Score 12/14/2019 12/10/2018 02/03/2018  Total CAT Score CAT COPD Symptom & Quality of Life Score (GSK trademark) 0 is no burden. 5 is highest burden 01/17/2016  02/03/2018   Never Cough -> Cough all the time 3 2  No phlegm in chest -> Chest is full of phlegm 3 3  No chest tightness -> Chest feels very tight 2 2  No dyspnea for 1 flight stairs/hill -> Very dyspneic for 1 flight of stairs 4 5  No limitations for ADL at home -> Very limited with ADL at home 4 4  Confident leaving home -> Not at all confident leaving home 3 3  Sleep soundly -> Do not sleep soundly because of lung condition 3 0  Lots of Energy -> No energy at all 4 5  TOTAL Score (max 40)  26 24    Simple office walk 185 feet x  3 laps goal with forehead probe 12/14/2019   O2 used ra  Number laps completed 3  Comments about pace slow  Resting Pulse Ox/HR 97% and 90/min  Final Pulse Ox/HR 92% and 113/min  Desaturated </= 88% no  Desaturated <= 3% points Yes, 5 poin  Got Tachycardic >/= 90/min yes  Symptoms at end of test Mild dyspnea  Miscellaneous comments x      IMPRESSION: 1. Lung-RADS 2, benign appearance or behavior. Continue annual screening with low-dose chest CT without contrast in 12 months. 2.  Emphysema (ICD10-J43.9). 3.  Aortic atherosclerosis (ICD10-170.0).   Electronically Signed   By: Leanna Battles M.D.   On: 07/01/2019 08:35   ROS - per  HPI   11/21/2020 Patient contacted today for acute visit. She originally called our office on 11/11/20 with reports of intermittent low grade fever and shortness of breath. She was prescribed course of Doxycyline and prednisone taper for suspected COPD exacerbations. She was advised to get covid tested. She took a home tested that came back negative, Dr. Marchelle Gearing recommended PCR testing. She reports that once she completed abx and prednisone her temperature came back.   Normally her breathing is labored with moderate exertion. She is currently experiencing a lot of wheezing. She has a chronic dry cough. Denies purulent mucus. Associated body aches, chills, dry mouth. She has not checked her temperature today. Last night her temperature was 100.4. She is on chronic pain medication which does have tylenol in it. She is scheduled for PCR covid testing today at 4pm. Dermatologist has her on  doxycycline once a day for the last several months. She missed LDCT this past September 2021 d/t covid precautions. Denies GI or urinary symptoms.     Observations/Objective:  - Able to speak in full sentences; no overt shortness of breath, wheezing or cough   Assessment and Plan:  COPD exacerbation: - Dry cough with associated low grade fever, chills and wheezing. Concern for prolonged AECOPD vs CAP, no improvement with doxycycline course - Rx Levaquin  qd x 7 days and prednisone taper (  x 2 days,  x 2 days,  x 2 days) - Change Symbicort 80 to ; continue Incruse Ellipta  - Needs PCR covid testing and checking CXR   Follow Up Instructions:  - After covid testing/CXR and due for regular follow-up with Dr. Marchelle Gearing    I discussed the assessment and treatment plan with the patient. The patient was provided an opportunity to ask questions and all were answered. The patient agreed with the plan  and demonstrated an understanding of the instructions.   The patient was advised to call  back or seek an in-person evaluation if the symptoms worsen or if the condition fails to improve as anticipated.  I provided 22 minutes of non-face-to-face time during this encounter.   Glenford Bayley, NP     11/25/2020 - Interim hx  Patient presents today for acute visit/ 4 day follow-up COPD exacerbations. She is on day #4 of Levaquin course. No difference on increased Symbicort dose. She is compliant with Incruse. Prednisone taper was extended by Dr. Marchelle Gearing. She had PCR covid test at [facility] on 11/21/20 that was negative. Her breathing has improved ever so slightly. She has a dry cough and wheezing. She was previously on oxygen but never got service. She has not had a fever since starting antibiotics. She is still vaping. She report that she has mold in a part of her house that is not used much. Denies GI symptoms, urinary symptoms. LDCT on 06/04/19 showed severe centrilobular emphysema. Mild biapical pleuroparenchymal scarring. Pulmonary nodules measure 5.9 mm or less in size.  CXR showed hyperinflation and bronchitic changes  Office treatment: - Depo-medrol 60mg  IM x1  - Check POC   Recommendations: - Stop Symbicort and Incruse - Start tomorrow- take two puffs morning and evening (use aerochamber) - Use 2L oxygen continuously to keep O2 >88-90%   - Continue Levaquin for 2 more days (discuss further abx with MR on Monday) - Stay on 20mg  prednisone daily until follow-up   Orders: - Needs labs either this weekend or Monday (D-dimer, BNP, CBC with diff, BMET) - Please provide patient with Aerochamber if she does not have one   Follow-up: - Monday televisit with MR   OV 11/28/2020  Subjective:  Patient ID: Tuesday, female , DOB: 11/28/1960 , age 13 y.o. , MRN: 10/26/1960 , ADDRESS: [address] [place] [id number] [place] [facility] [place], [place] Patient Care Team: [id number], MD as PCP - General (Family Medicine)  This Provider for this visit: Treatment Team:   Attending Provider: Aida Puffer, MD  Type of visit: Telephone/Video Circumstance: COVID-19 national emergency Identification of patient LESIA MONICA with 1961-04-22 and MRN Sonia Bush - 2 person identifier Risks: Risks, benefits, limitations of telephone visit explained. Patient understood and verbalized agreement to proceed Anyone else on call: none Patient location: at home This provider location: [address] [place] [phone]   11/28/2020 - AECOPD followup   HPI ERRIN WHITELAW 60 y.o. - finished steroid yestetday. Last day abx today.  Still some better only. Still unable to take shower. Feels better than few weeks ago. Says has o2 tank (thought small is heavy) sinc 11/25/20. Says is tripping her o2 tubing due to home set up of "intermittent steps". She lives in a trailer home. Every room has steps and even getting out.  Lives alone. Fell yesterday Bruised left knee "prettr bad: and foot is sore. Says [employer] company has smaller machines and wants prescriptions  Says she is fine with 2L [place]. Says room air she is fine but when she gets up and exerts  On BREZTRI now - she likes it  Has completed prednisone - 11/21/20 through 11/28/20 -> but feels she needs more  She hs blood work scheduled today but is reluctant to come due to fact she is worried about she wil trip on o2 but she is aware that without blood work data risk is on her. Advised her she can do  blood work at office and that data will be helpful for [place]. If getting worse to go to ER   CAT Score 12/14/2019 12/10/2018 02/03/2018  Total CAT Score 22 22 24          CT Chest data  DG Chest 2 View  Result Date: 11/25/2020 CLINICAL DATA:  Cough and low-grade fever.  History of COPD. EXAM: CHEST - 2 VIEW COMPARISON:  12/06/2016 FINDINGS: Lungs are hyperinflated. There is perihilar peribronchial thickening. There are no focal consolidations or pleural effusions. No pulmonary edema. IMPRESSION: Hyperinflation and bronchitic  changes. Electronically Signed   By: 02/05/2017 M.D.   On: 11/25/2020 16:38      PFT  PFT Results Latest Ref Rng & Units 03/31/2015  FVC-Pre L 1.33  FVC-Predicted Pre % 45  FVC-Post L 1.97  FVC-Predicted Post % 68  Pre FEV1/FVC % % 39  Post FEV1/FCV % % 34  FEV1-Pre L 0.51  FEV1-Predicted Pre % 22  FEV1-Post L 0.66  DLCO uncorrected ml/min/mmHg 4.57  DLCO UNC% % 25  DLVA Predicted % 42       has a past medical history of Anxiety, Asthmatic bronchitis, Chronic pain, COPD (chronic obstructive pulmonary disease) (HCC), and Hypertension.   reports that she quit smoking about 3 years ago. Her smoking use included cigarettes. She started smoking about 49 years ago. She has a 69.00 pack-year smoking history. She has never used smokeless tobacco.  Past Surgical History:  Procedure Laterality Date  . ECTOPIC PREGNANCY SURGERY    . TUBAL LIGATION      Allergies  Allergen Reactions  . Prevnar 13 [Pneumococcal 13-Val Conj Vacc]     Arm swelling    Immunization History  Administered Date(s) Administered  . Fluad Quad(high Dose 65+) 07/10/2020  . Influenza Split 08/01/2014, 07/27/2015  . Influenza,inj,Quad PF,6+ Mos 07/24/2016, 08/01/2017, 07/01/2018, 07/02/2019  . PFIZER(Purple Top)SARS-COV-2 Vaccination 12/17/2019, 01/11/2020, 09/07/2020  . Pneumococcal Conjugate-13 04/08/2015    Family History  Problem Relation Age of Onset  . Asthma Daughter   . Asthma Daughter   . Skin cancer Father   . Alzheimer's disease Father      Current Outpatient Medications:  .  albuterol (VENTOLIN HFA) 108 (90 Base) MCG/ACT inhaler, INHALE 2 PUFFS EVERY 6HRS AS NEEDED FOR WHEEZING/SHORTNESS OF BREATH, Disp: 8.5 each, Rfl: 0 .  betamethasone dipropionate 0.05 % lotion, APPLY TO THE SCALP DAILY TAPER USE AS ABLE, Disp: , Rfl:  .  Budeson-Glycopyrrol-Formoterol (BREZTRI AEROSPHERE) 160-9-4.8 MCG/ACT AERO, Inhale 2 puffs into the lungs in the morning and at bedtime., Disp: 5.9 g, Rfl:  0 .  budesonide-formoterol (SYMBICORT) 160-4.5 MCG/ACT inhaler, Inhale 2 puffs into the lungs in the morning and at bedtime., Disp: 1 each, Rfl: 0 .  clonazePAM (KLONOPIN) 1 MG tablet, Take 1 tablet (1 mg total) by mouth 2 (two) times daily. (Patient taking differently: Take 1 mg by mouth 2 (two) times daily. 0.25 tablet twice daily. Per patient stated she is trying to slowly get off of the medication.), Disp: 10 tablet, Rfl: 0 .  cloNIDine (CATAPRES) 0.2 MG tablet, Take 0.2 mg by mouth at bedtime. , Disp: , Rfl:  .  doxycycline (VIBRAMYCIN) 50 MG capsule, Take 50 mg by mouth daily. Per dermatology, Disp: , Rfl:  .  HYDROcodone-acetaminophen (NORCO) 10-325 MG tablet, Take 1 tablet by mouth every 6 (six) hours as needed for moderate pain. , Disp: , Rfl:  .  INCRUSE ELLIPTA 62.5 MCG/INH AEPB, INHALE 1 PUFF BY MOUTH EVERY DAY,  Disp: 90 each, Rfl: 1 .  irbesartan (AVAPRO) 150 MG tablet, , Disp: , Rfl: 3 .  levofloxacin (LEVAQUIN) 500 MG tablet, Take 1 tablet (500 mg total) by mouth daily., Disp: 7 tablet, Rfl: 0 .  MAGNESIUM PO, Take by mouth daily., Disp: , Rfl:  .  Multiple Vitamins-Minerals (MULTI FOR HER 50+ PO), Take 1 tablet by mouth daily., Disp: , Rfl:  .  Potassium (POTASSIMIN PO), Take by mouth., Disp: , Rfl:  .  predniSONE (DELTASONE) 10 MG tablet, Take 4 tabs po daily x 2 days; then 2 tabs for 2 days; then 1 tab for 2 days, Disp: 14 tablet, Rfl: 0 .  Vitamin D, Ergocalciferol, (DRISDOL) 1.25 MG (50000 UNIT) CAPS capsule, Take 50,000 Units by mouth once a week., Disp: , Rfl:  No current facility-administered medications for this visit.  Facility-Administered Medications Ordered in Other Visits:  .  albuterol (PROVENTIL) (2.5 MG/3ML) 0.083% nebulizer solution 2.5 mg, 2.5 mg, Nebulization, Once, Kalman Shan, MD      Objective:   There were no vitals filed for this visit.  Estimated body mass index is 20.24 kg/m as calculated from the following:   Height as of 11/25/20: 4\' 11"   (1.499 m).   Weight as of 11/25/20: 100 lb 3.2 oz (45.5 kg).  @WEIGHTCHANGE @  There were no vitals filed for this visit.   Physical Exam Sounded normal on exam verbiose +       Assessment:       ICD-10-CM   1. COPD with acute exacerbation (HCC)  J44.1   2. COPD, very severe (HCC)  J44.9   3. History of falling, presenting hazards to health  Z91.81        Plan:     Patient Instructions     ICD-10-CM   1. COPD with acute exacerbation (HCC)  J44.1   2. COPD, very severe (HCC)  J44.9   3. History of falling, presenting hazards to health  Z91.81    Improving but not resolved  Plan  - no need for further antibiotics   - redo prednisone - Take prednisone 40 mg daily x 2 days, then 20mg  daily x 2 days, then 10mg  daily x 2 days, then 5mg  daily x 2 days and stop  - aim for smaller portable o2 system - 2LNC due to heaviness of small tanks and fall risk and having fallen   - continue breztri daily  - do albuterol prn   - cotinue 2L [place] o2 -  -do blood work that is ordered - (D-dimer, BNP, CBC with diff, BMET) -   - go to ER if worse  Followup - blood work asap  - April 2022 with Dr face to face meeting   (Telephone visit - Level 03 visit: Estb 21-30 for this visit type which was visit type: telephone visit in total care time and counseling or/and coordination of care by this undersigned MD - Dr . This includes one or more of the following for care delivered on 11/28/2020 same day: pre-charting, chart review, note writing, documentation discussion of test results, diagnostic or treatment recommendations, prognosis, risks and benefits of management options, instructions, education, compliance or risk-factor reduction. It excludes time spent by the CMA or office staff in the care of the patient. Actual time was 21 min. E&M code is [phone])   SIGNATURE    Dr. May 2022, M.D., F.C.C.P,  Pulmonary and Critical Care Medicine Staff Physician,  [facility] Director -  Interstitial Lung Disease  Program  [facility] at [facility] [place], [place], [address]  Pager: [phone], If no answer or between  15:00h - 7:00h: call [phone] Telephone: [phone]  12:25 PM 11/28/2020

## 2020-11-28 NOTE — Patient Instructions (Addendum)
ICD-10-CM   1. COPD with acute exacerbation (HCC)  J44.1   2. COPD, very severe (HCC)  J44.9   3. History of falling, presenting hazards to health  Z91.81    Improving but not resolved  Plan  - no need for further antibiotics   - redo prednisone - Take prednisone 40 mg daily x 2 days, then 20mg  daily x 2 days, then 10mg  daily x 2 days, then 5mg  daily x 2 days and stop  - aim for smaller portable o2 system - 2LNC due to heaviness of small tanks and fall risk and having fallen   - continue breztri daily  - do albuterol prn   - cotinue 2L [place] o2 -  -do blood work that is ordered - (D-dimer, BNP, CBC with diff, BMET) -   - go to ER if worse  Followup - blood work asap  - April 2022 with Dr face to face meeting

## 2020-11-28 NOTE — Addendum Note (Signed)
Addended by: Wyvonne Lenz on: 11/28/2020 12:48 PM   Modules accepted: Orders

## 2020-12-01 ENCOUNTER — Emergency Department (HOSPITAL_COMMUNITY)
Admission: EM | Admit: 2020-12-01 | Discharge: 2020-12-01 | Disposition: A | Discharge status: Home / Self Care | Payer: Medicare Other | Attending: Emergency Medicine | Admitting: Emergency Medicine

## 2020-12-01 ENCOUNTER — Telehealth: Payer: Self-pay | Admitting: Internal Medicine

## 2020-12-01 ENCOUNTER — Encounter (HOSPITAL_COMMUNITY): Payer: Self-pay

## 2020-12-01 ENCOUNTER — Other Ambulatory Visit: Payer: Self-pay

## 2020-12-01 ENCOUNTER — Other Ambulatory Visit (INDEPENDENT_AMBULATORY_CARE_PROVIDER_SITE_OTHER): Payer: Medicare Other

## 2020-12-01 DIAGNOSIS — Z87891 Personal history of nicotine dependence: Secondary | ICD-10-CM | POA: Diagnosis not present

## 2020-12-01 DIAGNOSIS — Z79899 Other long term (current) drug therapy: Secondary | ICD-10-CM | POA: Insufficient documentation

## 2020-12-01 DIAGNOSIS — R739 Hyperglycemia, unspecified: Secondary | ICD-10-CM

## 2020-12-01 DIAGNOSIS — J45909 Unspecified asthma, uncomplicated: Secondary | ICD-10-CM | POA: Diagnosis not present

## 2020-12-01 DIAGNOSIS — I10 Essential (primary) hypertension: Secondary | ICD-10-CM | POA: Diagnosis not present

## 2020-12-01 DIAGNOSIS — Z7951 Long term (current) use of inhaled steroids: Secondary | ICD-10-CM | POA: Diagnosis not present

## 2020-12-01 DIAGNOSIS — R0689 Other abnormalities of breathing: Secondary | ICD-10-CM | POA: Diagnosis not present

## 2020-12-01 DIAGNOSIS — R0602 Shortness of breath: Secondary | ICD-10-CM | POA: Insufficient documentation

## 2020-12-01 DIAGNOSIS — J441 Chronic obstructive pulmonary disease with (acute) exacerbation: Secondary | ICD-10-CM | POA: Diagnosis not present

## 2020-12-01 DIAGNOSIS — R0600 Dyspnea, unspecified: Secondary | ICD-10-CM | POA: Diagnosis not present

## 2020-12-01 DIAGNOSIS — J449 Chronic obstructive pulmonary disease, unspecified: Secondary | ICD-10-CM

## 2020-12-01 DIAGNOSIS — E1165 Type 2 diabetes mellitus with hyperglycemia: Secondary | ICD-10-CM | POA: Insufficient documentation

## 2020-12-01 HISTORY — DX: Type 2 diabetes mellitus without complications: E11.9

## 2020-12-01 LAB — CBC WITH DIFFERENTIAL/PLATELET
Basophils Absolute: 0 10*3/uL (ref 0.0–0.1)
Basophils Relative: 0.1 % (ref 0.0–3.0)
Eosinophils Absolute: 0.1 10*3/uL (ref 0.0–0.7)
Eosinophils Relative: 1 % (ref 0.0–5.0)
HCT: 41.6 % (ref 36.0–46.0)
Hemoglobin: 13.7 g/dL (ref 12.0–15.0)
Lymphocytes Relative: 7.3 % — ABNORMAL LOW (ref 12.0–46.0)
Lymphs Abs: 0.9 10*3/uL (ref 0.7–4.0)
MCHC: 32.9 g/dL (ref 30.0–36.0)
MCV: 95.1 fl (ref 78.0–100.0)
Monocytes Absolute: 0.5 10*3/uL (ref 0.1–1.0)
Monocytes Relative: 4 % (ref 3.0–12.0)
Neutro Abs: 10.2 10*3/uL — ABNORMAL HIGH (ref 1.4–7.7)
Neutrophils Relative %: 87.6 % — ABNORMAL HIGH (ref 43.0–77.0)
Platelets: 448 10*3/uL — ABNORMAL HIGH (ref 150.0–400.0)
RBC: 4.37 Mil/uL (ref 3.87–5.11)
RDW: 13 % (ref 11.5–15.5)
WBC: 11.7 10*3/uL — ABNORMAL HIGH (ref 4.0–10.5)

## 2020-12-01 LAB — URINALYSIS, ROUTINE W REFLEX MICROSCOPIC
Bacteria, UA: NONE SEEN
Bilirubin Urine: NEGATIVE
Glucose, UA: >=500 mg/dL — AB
Hgb urine dipstick: NEGATIVE
Ketones, ur: 20 mg/dL — AB
Leukocytes,Ua: NEGATIVE
Nitrite: NEGATIVE
Protein, ur: NEGATIVE mg/dL
Specific Gravity, Urine: 1.009 (ref 1.005–1.030)
pH: 6 (ref 5.0–8.0)

## 2020-12-01 LAB — BASIC METABOLIC PANEL
Anion gap: 14 (ref 5–15)
BUN: 23 mg/dL (ref 6–23)
BUN: 22 mg/dL (ref 8–23)
CO2: 30 mEq/L (ref 19–32)
CO2: 25 mmol/L (ref 22–32)
Calcium: 10.1 mg/dL (ref 8.4–10.5)
Calcium: 10.1 mg/dL (ref 8.9–10.3)
Chloride: 91 mEq/L — ABNORMAL LOW (ref 96–112)
Chloride: 90 mmol/L — ABNORMAL LOW (ref 98–111)
Creatinine, Ser: 0.76 mg/dL (ref 0.40–1.20)
Creatinine, Ser: 0.82 mg/dL (ref 0.44–1.00)
GFR, Estimated: >60 mL/min (ref >60)
GFR: 84.76 mL/min (ref >60.00)
Glucose, Bld: 504 mg/dL (ref 70–99)
Glucose, Bld: 511 mg/dL (ref 70–99)
Potassium: 4.3 mEq/L (ref 3.5–5.1)
Potassium: 4.7 mmol/L (ref 3.5–5.1)
Sodium: 128 mEq/L — ABNORMAL LOW (ref 135–145)
Sodium: 129 mmol/L — ABNORMAL LOW (ref 135–145)

## 2020-12-01 LAB — CBC
HCT: 45.6 % (ref 36.0–46.0)
Hemoglobin: 14.6 g/dL (ref 12.0–15.0)
MCH: 30.6 pg (ref 26.0–34.0)
MCHC: 32 g/dL (ref 30.0–36.0)
MCV: 95.6 fL (ref 80.0–100.0)
Platelets: 450 10*3/uL — ABNORMAL HIGH (ref 150–400)
RBC: 4.77 MIL/uL (ref 3.87–5.11)
RDW: 12.2 % (ref 11.5–15.5)
WBC: 10.1 10*3/uL (ref 4.0–10.5)
nRBC: 0 % (ref 0.0–0.2)

## 2020-12-01 LAB — CBG MONITORING, ED
Glucose-Capillary: 492 mg/dL — ABNORMAL HIGH (ref 70–99)
Glucose-Capillary: 276 mg/dL — ABNORMAL HIGH (ref 70–99)

## 2020-12-01 LAB — D-DIMER, QUANTITATIVE: D-Dimer, Quant: 0.34 mcg/mL FEU (ref <0.50)

## 2020-12-01 LAB — BRAIN NATRIURETIC PEPTIDE: Pro B Natriuretic peptide (BNP): 18 pg/mL (ref 0.0–100.0)

## 2020-12-01 MED ORDER — SODIUM CHLORIDE 0.9 % IV BOLUS
1000.0000 mL | Freq: Once | INTRAVENOUS | Status: AC
  Administered 2020-12-01: 1000 mL via INTRAVENOUS

## 2020-12-01 MED ORDER — INSULIN ASPART 100 UNIT/ML ~~LOC~~ SOLN
6.0000 [IU] | Freq: Once | SUBCUTANEOUS | Status: AC
  Administered 2020-12-01: 6 [IU] via SUBCUTANEOUS
  Filled 2020-12-01: qty 0.06

## 2020-12-01 NOTE — Telephone Encounter (Signed)
  Thanks Christiane Ha   LABS    PULMONARY No results for input(s): PHART, PCO2ART, PO2ART, HCO3, TCO2, O2SAT in the last 168 hours.  Invalid input(s): PCO2, PO2  CBC Recent Labs  Lab 12/01/20 1508  HGB 13.7  HCT 41.6  WBC 11.7*  PLT 448.0*    COAGULATION No results for input(s): INR in the last 168 hours.  CARDIAC  No results for input(s): TROPONINI in the last 168 hours. Recent Labs  Lab 12/01/20 1508  PROBNP 18.0     CHEMISTRY Recent Labs  Lab 12/01/20 1508  NA 128*  K 4.3  CL 91*  CO2 30  GLUCOSE 504*  BUN 23  CREATININE 0.76  CALCIUM 10.1   Estimated Creatinine Clearance: 50.4 mL/min (by C-G formula based on SCr of 0.76 mg/dL).   LIVER No results for input(s): AST, ALT, ALKPHOS, BILITOT, PROT, ALBUMIN, INR in the last 168 hours.   INFECTIOUS No results for input(s): LATICACIDVEN, PROCALCITON in the last 168 hours.   ENDOCRINE CBG (last 3)  No results for input(s): GLUCAP in the last 72 hours.       IMAGING x48h  - image(s) personally visualized  -   highlighted in bold No results found.

## 2020-12-01 NOTE — Telephone Encounter (Signed)
Patient should go to the emergency room to have her labs drawn again and her blood sugar managed if it is indeed this high.   She is not a known diabetic and is not on any medications to adjust as an outpatient.  Thanks, Cletis Athens

## 2020-12-01 NOTE — ED Triage Notes (Signed)
Patient went to her pulmonologist today and had blood work done.  Patient was found to have a glucose level of 504.  patient is on home O2 2L/min via 

## 2020-12-01 NOTE — Telephone Encounter (Signed)
Called patient, confirmed DOB. Made aware of MD recommendations regarding going to ED to have labs re-drawn due to CBG of 504. Voiced understanding. Patient to have her daughter take her to ED.   Nothing further needed at this time.

## 2020-12-01 NOTE — Telephone Encounter (Signed)
If pt is that short of breath even with O2 on, pt needs to go to the ED to be evaluated.  Attempted to call pt but unable to reach.

## 2020-12-01 NOTE — ED Provider Notes (Signed)
[facility]-EMERGENCY DEPT Provider Note   CSN: [id number] Arrival date & time: 12/01/20  1824     History Chief Complaint  Patient presents with  . Abnormal Lab    Sonia Bush is a 60 y.o. female.  HPI   60 year old female with past medical history of HTN, oxygen dependent COPD, DM presents the emergency department concern for hyperglycemia.  Patient has been having worsening shortness of breath that is being evaluated as an outpatient.  She recently had an upper respiratory infection that was treated with 2 rounds of antibiotics and 2 rounds of steroids, she is currently on steroids.  Was on Metformin previously for diabetes but this has been stopped a couple months ago.  Patient went in for routine blood work and was noted to have hyperglycemia and sent here for further evaluation.  Patient admits to feeling thirsty but otherwise denies any other acute illness including fever, chest pain, productive cough, abdominal pain, vomiting or diarrhea.  No genitourinary symptoms.  Past Medical History:  Diagnosis Date  . Anxiety   . Asthmatic bronchitis   . Chronic pain   . COPD (chronic obstructive pulmonary disease) (HCC)   . Diabetes mellitus without complication (HCC)   . Hypertension     Patient Active Problem List   Diagnosis Date Noted  . Chronic respiratory failure (HCC) 11/06/2016  . Cramp of both lower extremities 01/17/2016  . COPD exacerbation (HCC) 10/14/2015  . Oral thrush 10/14/2015  . Adverse drug reaction 04/16/2015  . COPD, very severe (HCC) 04/08/2015  . Dyspnea and respiratory abnormality 03/25/2015  . Panlobular emphysema (HCC) 03/25/2015  . Smoking 03/25/2015  . History of pneumonia 03/25/2015    Past Surgical History:  Procedure Laterality Date  . ECTOPIC PREGNANCY SURGERY    . TUBAL LIGATION       OB History   No obstetric history on file.     Family History  Problem Relation Age of Onset  . Asthma Daughter   .  Asthma Daughter   . Skin cancer Father   . Alzheimer's disease Father     Social History   Tobacco Use  . Smoking status: Former Smoker    Packs/day: 1.50    Years: 46.00    Pack years: 69.00    Types: Cigarettes    Start date: 17    Quit date: 11/01/2017    Years since quitting: 3.0  . Smokeless tobacco: Never Used  Vaping Use  . Vaping Use: Every day  . Substances: Nicotine  Substance Use Topics  . Alcohol use: Not Currently    Alcohol/week: 14.0 standard drinks    Types: 14 Cans of beer per week  . Drug use: No    Home Medications Prior to Admission medications   Medication Sig Start Date End Date Taking? Authorizing Provider  albuterol (VENTOLIN HFA) 108 (90 Base) MCG/ACT inhaler INHALE 2 PUFFS EVERY 6HRS AS NEEDED FOR WHEEZING/SHORTNESS OF BREATH 11/16/20   Kalman Shan, MD  betamethasone dipropionate 0.05 % lotion APPLY TO THE SCALP DAILY TAPER USE AS ABLE 12/01/19   [provider]  Budeson-Glycopyrrol-Formoterol (BREZTRI AEROSPHERE) 160-9-4.8 MCG/ACT AERO Inhale 2 puffs into the lungs in the morning and at bedtime. 11/25/20   Glenford Bayley, NP  budesonide-formoterol [facility]) 160-4.5 MCG/ACT inhaler Inhale 2 puffs into the lungs in the morning and at bedtime. 11/21/20   Glenford Bayley, NP  clonazePAM (KLONOPIN) 1 MG tablet Take 1 tablet (1 mg total) by mouth 2 (two)  times daily. Patient taking differently: Take 1 mg by mouth 2 (two) times daily. 0.25 tablet twice daily. Per patient stated she is trying to slowly get off of the medication. 03/31/18   Jetty Duhamel D, MD  cloNIDine (CATAPRES) 0.2 MG tablet Take 0.2 mg by mouth at bedtime.     [provider]  doxycycline (VIBRAMYCIN) 50 MG capsule Take 50 mg by mouth daily. Per dermatology    [provider]  HYDROcodone-acetaminophen (NORCO) 10-325 MG tablet Take 1 tablet by mouth every 6 (six) hours as needed for moderate pain.     [provider]  INCRUSE ELLIPTA 62.5  MCG/INH AEPB INHALE 1 PUFF BY MOUTH EVERY DAY 09/02/20   Kalman Shan, MD  irbesartan (AVAPRO) 150 MG tablet  01/24/18   [provider]  levofloxacin (LEVAQUIN) 500 MG tablet Take 1 tablet (500 mg total) by mouth daily. 11/21/20   Glenford Bayley, NP  MAGNESIUM PO Take by mouth daily.    [provider]  Multiple Vitamins-Minerals (MULTI FOR HER 50+ PO) Take 1 tablet by mouth daily.    [provider]  Potassium (POTASSIMIN PO) Take by mouth.    [provider]  predniSONE (DELTASONE) 10 MG tablet Take 4 tablets (40 mg total) by mouth daily with breakfast for 2 days, THEN 2 tablets (20 mg total) daily with breakfast for 2 days, THEN 1 tablet (10 mg total) daily with breakfast for 2 days, THEN 0.5 tablets (5 mg total) daily with breakfast for 2 days. 11/28/20 12/06/20  Kalman Shan, MD  Vitamin D, Ergocalciferol, (DRISDOL) 1.25 MG (50000 UNIT) CAPS capsule Take 50,000 Units by mouth once a week. 11/30/19   [provider]    Allergies    Prevnar 13 [pneumococcal 13-val conj vacc]  Review of Systems   Review of Systems  Constitutional: Negative for chills and fever.  HENT: Negative for congestion.   Eyes: Negative for visual disturbance.  Respiratory: Negative for shortness of breath.   Cardiovascular: Negative for chest pain.  Gastrointestinal: Negative for abdominal pain, diarrhea and vomiting.  Endocrine: Positive for polydipsia.  Genitourinary: Negative for dysuria.  Skin: Negative for rash.  Neurological: Negative for headaches.    Physical Exam Updated Vital Signs BP (!) 149/81   Pulse 84   Temp 98.3 F (36.8 C) (Oral)   Resp 18   Ht 4\' 11"  (1.499 m)   Wt 46.3 kg   SpO2 99%   BMI 20.60 kg/m   Physical Exam Vitals and nursing note reviewed.  Constitutional:      Appearance: Normal appearance.  HENT:     Head: Normocephalic.     Mouth/Throat:     Mouth: Mucous membranes are moist.  Cardiovascular:     Rate and  Rhythm: Normal rate.  Pulmonary:     Effort: Pulmonary effort is normal. No respiratory distress.     Breath sounds: Wheezing present.  Abdominal:     Palpations: Abdomen is soft.     Tenderness: There is no abdominal tenderness.  Musculoskeletal:        General: No swelling.  Skin:    General: Skin is warm.  Neurological:     Mental Status: She is alert and oriented to person, place, and time. Mental status is at baseline.  Psychiatric:        Mood and Affect: Mood normal.     ED Results / Procedures / Treatments   Labs (all labs ordered are listed, but only abnormal results  are displayed) Labs Reviewed  BASIC METABOLIC PANEL - Abnormal; Notable for the following components:      Result Value   Sodium 129 (*)    Chloride 90 (*)    Glucose, Bld 511 (*)    All other components within normal limits  CBC - Abnormal; Notable for the following components:   Platelets 450 (*)    All other components within normal limits  URINALYSIS, ROUTINE W REFLEX MICROSCOPIC - Abnormal; Notable for the following components:   Color, Urine STRAW (*)    Glucose, UA >=500 (*)    Ketones, ur 20 (*)    All other components within normal limits  CBG MONITORING, ED - Abnormal; Notable for the following components:   Glucose-Capillary 492 (*)    All other components within normal limits    EKG None  Radiology No results found.  Procedures Procedures   Medications Ordered in ED Medications  insulin aspart (novoLOG) injection 6 Units (has no administration in time range)  sodium chloride 0.9 % bolus 1,000 mL (1,000 mLs Intravenous New Bag/Given 12/01/20 1936)    ED Course  I have reviewed the triage vital signs and the nursing notes.  Pertinent labs & imaging results that were available during my care of the patient were reviewed by me and considered in my medical decision making (see chart for details).    MDM Rules/Calculators/A&P                          60 year old female  presents the emergency department with concern for hyperglycemia found on outpatient blood work. Patient has h/o of DM, treated with Metformin in the past, most recently diet controlled.  Patient has had 2 rounds of steroids in the past 3 weeks and is currently on a taper regimen for respiratory reasons.  Blood work today shows a hyperglycemia of 511, pseudohyponatremia of 129, normal potassium, normal bicarb and anion gap. Not consistent with DKA or HHS. After fluids and SQ insulin blood sugar has appropriately reduced to 276.  Patient feels well, is able to p.o.  Discussed with the patient tapering off the steroids as this is the cause of the hyperglycemia I believe.  She understands and agrees, will follow up with her primary doctor for repeat blood work.  Patient will be discharged and treated as an outpatient.  Discharge plan and strict return to ED precautions discussed, patient verbalizes understanding and agreement.  Final Clinical Impression(s) / ED Diagnoses Final diagnoses:  None    Rx / DC Orders ED Discharge Orders    None       Rozelle Logan, DO 12/01/20 2302

## 2020-12-01 NOTE — Discharge Instructions (Addendum)
You have been seen and discharged from the emergency department.  You were diagnosed with high blood sugar, this is most likely a side effect of your steroids.  Taper the steroids as we discussed.  Follow-up with your primary provider for reevaluation. Take home medications as prescribed. If you have any worsening symptoms or further concerns for health please return to an emergency department for further evaluation.

## 2020-12-01 NOTE — Telephone Encounter (Signed)
Received call report from Irma with Lab on patient's glucose done on 12/01/20 BW please review the result/impression copied below:  CBG 504  Please advise, thank you.

## 2020-12-01 NOTE — Telephone Encounter (Signed)
Patient is returning phone call. Patient phone number is [phone].

## 2020-12-01 NOTE — Telephone Encounter (Signed)
Called patient, confirmed DOB. Made aware of MD recommendations regarding going to ED to have labs re-drawn due to CBG of 504. Voiced understanding. Patient to have her daughter take her to ED.   Nothing further needed at this time.  

## 2020-12-12 ENCOUNTER — Other Ambulatory Visit: Payer: Self-pay | Admitting: Primary Care

## 2020-12-12 ENCOUNTER — Other Ambulatory Visit: Payer: Self-pay | Admitting: Internal Medicine

## 2021-01-10 ENCOUNTER — Other Ambulatory Visit: Payer: Self-pay

## 2021-01-10 ENCOUNTER — Ambulatory Visit (INDEPENDENT_AMBULATORY_CARE_PROVIDER_SITE_OTHER): Payer: Medicare Other | Admitting: Internal Medicine

## 2021-01-10 ENCOUNTER — Encounter: Payer: Self-pay | Admitting: Internal Medicine

## 2021-01-10 VITALS — BP 110/68 | HR 101 | Temp 97.9°F | Ht 59.5 in | Wt 92.0 lb

## 2021-01-10 DIAGNOSIS — J449 Chronic obstructive pulmonary disease, unspecified: Secondary | ICD-10-CM

## 2021-01-10 DIAGNOSIS — Z87891 Personal history of nicotine dependence: Secondary | ICD-10-CM

## 2021-01-10 DIAGNOSIS — J9611 Chronic respiratory failure with hypoxia: Secondary | ICD-10-CM

## 2021-01-10 DIAGNOSIS — R0689 Other abnormalities of breathing: Secondary | ICD-10-CM

## 2021-01-10 DIAGNOSIS — R0600 Dyspnea, unspecified: Secondary | ICD-10-CM | POA: Diagnosis not present

## 2021-01-10 NOTE — Patient Instructions (Addendum)
ICD-10-CM   1. COPD, very severe (HCC)  J44.9   2. Chronic respiratory failure with hypoxia (HCC)  J96.11   3. Former smoker  Z87.891   4. Dyspnea and respiratory abnormality  R06.00    R06.89     COPD itself appears stable although your are a frequent exacerbaor Current 10 pound weight loss is likely due to Metformin Too bad Breztri did not work - caused some blistering  Plan  -Continue Symbicort daily and Incruse daily  - list breztri in allergy -Albuterol as needed -Oxygen with exertion 2 L nasal cannula and as needed at night  -Do high-resolution CT chest without contrast in the next few to several weeks -Do spirometry pre and postbronchodilator and DLCO -Do echocardiogram [I do not see one in your chart] -Consider arterial blood gas   Follow-up -Return to see Dr. Marchelle Gearing or nurse practitioner in the next few weeks to few months but to discuss all the above results  -At follow-up we could consider Roflumilast for repeated COPD exacerbation prevention

## 2021-01-10 NOTE — Progress Notes (Signed)
OV 01/17/2016  Chief Complaint  Patient presents with  . Follow-up    Pt states her breathing is at baseline. Pt states she feels the symbicort makes her wheeze and then feels she needs to use her albuterol hfa.    Follow-up Gold stage IV COPD-not on home oxygen but on triple inhaler therapy: She had an exacerbation July 2016 around the time I started her on triple inhaler therapy. That was treated with prednisone. Then in January 2017 at the time of last visit had another exacerbation treated with anabiotic's and prednisone. She tells me that several weeks after that she saw primary care physician and had to get repeat antibiotics or prednisone of which she is unsure. She currently feels stable. COPD cat score is 26 and reflects a stable baseline COPD which is improved compared to a year ago. Nevertheless she tells me that Symbicort makes a wheeze  Smoking: Continues to smoke. Struggles to quit  New issue is leg cramps: Ever since she started inhalers she's having leg cramps. Self treated with over-the-counter potassium which has helped. When she does not take potassium leg cramps get worse. She wants me to check her chemistry levels.  Lung cancer screening: She is eligible for this but she will need a referral to the cancer screening program.       OV 07/24/2016  Chief Complaint  Patient presents with  . Follow-up    Pt states her SOB is unchanged since last OV. Pt c/o prod cough with white mucus - at baseline. Pt denies CP/tightness and f/c/s.       Follow-up Gold stage IV COPD. Not on home oxygen but on triple  inhaler therapy. Since last visit she continues to smoke. She is unable to pulmonary rehabilitation because of Medicaidlack of coverage. Her triple inhaler therapy is now Incruse and symbicort. She felt Spiriva was causing of thrush. [facility] because these were ineffective according to subjective opinion. Current triple inhaler therapy  works well for her. She will have a flu shot. There are no new issues.    OV 11/01/2016  Chief Complaint  Patient presents with  . Acute Visit    Pt c/o increase in SOB an prod cough with clear mucus and body aches, fever. Pt states she was given doxy. Pt states she has not taken her temp but she has had chills.    Acute visit for Gold stage IV COPD patient was not on oxygen therapy but on triple inhaler therapy and has continued to smoke.  Approximately one half weeks ago she developed a head cold. She saw her primary care physician and was given one half days of amoxicillin but this did not help. Subsequently on 10/25/2016 because of unimproved symptoms of cough headache or wheezing and subjective feverishness she saw primary care physician again and was prescribed doxycycline. She does not recollect chest x-ray at any time. Of note she is up-to-date with her flu shot. She does not think she has a flu but she does have fatigue. Therefore she decided to come in acutely today and upon arrival was hypoxemic on room air requiring. As of oxygen. This is a new finding for her. She is able to talk sentences but does not desire admission to the hospital. This no edema or hemoptysis or syncope   OV 02/03/2018  Chief Complaint  Patient presents with  . Follow-up    Pt states she has been doing good  since last visit. Pt states she did quit smoking cigs but is doing the vape. Denies any complaints of cough, sob, or CP.    Follow-up Gold stage IV COPD not on oxygen therapy but on triple inhaler therapy and heavy smoker  Last seen February 2018 which is 14 months ago.  Since then overall COPD stable without any exacerbation.  She continues with Incruse and Symbicort.  She feels Symbicort works better for her.  In the past she has tried Engineer, materials and this is not helped.  She had questions about Trelegy inhaler but after talking to her that it was Incruse and Breo combination she does not want to do it.  Moreover  she has not had recurrent exacerbations.  Her COPD CAT score is 24 and slightly better than her baseline from 2 years ago.  Last CT scan of the chest was in 2016 without any evidence of lung cancer.  In talking to her she smoked 1 pack a day since age 32 through age 65.  Recently she has converted to vape in an effort to quit smoking but then she says she is liking vape and is worried that she might get addicted to it.  Review of her vaccines show that she has had Prevnar but developed an allergic reaction requiring prednisone with redness and itching in her left upper extremity.  There are no other new issues  OV 12/14/2019  Subjective:  Patient ID: Sonia Bush, female , DOB: 05-25-1961 , age 71 y.o. , MRN: [id number] , ADDRESS: [address] [place] [address]   12/14/2019 -   Chief Complaint  Patient presents with  . Follow-up    Last seen 02/03/18. Pt states she has been doing okay since last visit and denies any complaints.    - Follow-up Gold stage IV COPD not on oxygen therapy but on triple inhaler therapy   - Quit smoking 2019 but vaping   HPI Sonia Bush 59 y.o. -    Gold stage IV COPD but not on oxygen: She continues on Symbicort and Incruse.  Not seen her in 2 years.  Overall she is stable.  COPD CAT score shows stability.  She has been social distancing and keeping herself as safe as possible  Smoking: She quit tobacco in 2019.  She switched to vaping in an effort to quit tobacco smoking.  However she has not been able to come off vaping.  She understands the need.  She says that she slowly is working her way into quitting vaping  New issue: Vaccine counseling.  She is keen on getting the COVID-19 vaccine.  She initially asked questions about how to get it.  She has had difficulty reaching vaccination website for Covid.  Then upon review of the chart I noticed that in 2019 she indicated to me she had allergic reaction in her left upper extremity after getting  Prevnar.  She did get prednisone and get better.  Otherwise she has had other vaccines without any problems.  She does not have any allergies otherwise.  She tells me that she is very keen on getting the COVID-19 vaccine.  She tells me that if Moderna vaccine is the only choice [slightly higher side effect of arm swelling with Moderna) then she will take it.  She prefers Teacher, adult education due to higher efficacy.  I have advised her that Pfizer would be #1 choice.  Followed by Oletta Darter or Moderna based on her analysis of risk  benefit.  CAT Score 12/14/2019 12/10/2018 02/03/2018  Total CAT Score CAT COPD Symptom & Quality of Life Score (GSK trademark) 0 is no burden. 5 is highest burden 01/17/2016  02/03/2018   Never Cough -> Cough all the time 3 2  No phlegm in chest -> Chest is full of phlegm 3 3  No chest tightness -> Chest feels very tight 2 2  No dyspnea for 1 flight stairs/hill -> Very dyspneic for 1 flight of stairs 4 5  No limitations for ADL at home -> Very limited with ADL at home 4 4  Confident leaving home -> Not at all confident leaving home 3 3  Sleep soundly -> Do not sleep soundly because of lung condition 3 0  Lots of Energy -> No energy at all 4 5  TOTAL Score (max 40)  26 24    Simple office walk 185 feet x  3 laps goal with forehead probe 12/14/2019   O2 used ra  Number laps completed 3  Comments about pace slow  Resting Pulse Ox/HR 97% and 90/min  Final Pulse Ox/HR 92% and 113/min  Desaturated </= 88% no  Desaturated <= 3% points Yes, 5 poin  Got Tachycardic >/= 90/min yes  Symptoms at end of test Mild dyspnea  Miscellaneous comments x      IMPRESSION: 1. Lung-RADS 2, benign appearance or behavior. Continue annual screening with low-dose chest CT without contrast in 12 months. 2.  Emphysema (ICD10-J43.9). 3.  Aortic atherosclerosis (ICD10-170.0).   Electronically Signed   By: Leanna Battles M.D.   On: 07/01/2019 08:35   ROS - per  HPI   11/21/2020 Patient contacted today for acute visit. She originally called our office on 11/11/20 with reports of intermittent low grade fever and shortness of breath. She was prescribed course of Doxycyline and prednisone taper for suspected COPD exacerbations. She was advised to get covid tested. She took a home tested that came back negative, Dr. Marchelle Gearing recommended PCR testing. She reports that once she completed abx and prednisone her temperature came back.   Normally her breathing is labored with moderate exertion. She is currently experiencing a lot of wheezing. She has a chronic dry cough. Denies purulent mucus. Associated body aches, chills, dry mouth. She has not checked her temperature today. Last night her temperature was 100.4. She is on chronic pain medication which does have tylenol in it. She is scheduled for PCR covid testing today at 4pm. Dermatologist has her on  doxycycline once a day for the last several months. She missed LDCT this past September 2021 d/t covid precautions. Denies GI or urinary symptoms.     Observations/Objective:  - Able to speak in full sentences; no overt shortness of breath, wheezing or cough   Assessment and Plan:  COPD exacerbation: - Dry cough with associated low grade fever, chills and wheezing. Concern for prolonged AECOPD vs CAP, no improvement with doxycycline course - Rx Levaquin  qd x 7 days and prednisone taper (  x 2 days,  x 2 days,  x 2 days) - Change Symbicort 80 to ; continue Incruse Ellipta  - Needs PCR covid testing and checking CXR   Follow Up Instructions:  - After covid testing/CXR and due for regular follow-up with Dr. Marchelle Gearing    I discussed the assessment and treatment plan with the patient. The patient was provided an opportunity to ask questions and all were answered. The patient agreed with the plan  and demonstrated an understanding of the instructions.   The patient was advised to call  back or seek an in-person evaluation if the symptoms worsen or if the condition fails to improve as anticipated.  I provided 22 minutes of non-face-to-face time during this encounter.   Glenford Bayley, NP     11/25/2020 - Interim hx  Patient presents today for acute visit/ 4 day follow-up COPD exacerbations. She is on day #4 of Levaquin course. No difference on increased Symbicort dose. She is compliant with Incruse. Prednisone taper was extended by Dr. Marchelle Gearing. She had PCR covid test at [facility] on 11/21/20 that was negative. Her breathing has improved ever so slightly. She has a dry cough and wheezing. She was previously on oxygen but never got service. She has not had a fever since starting antibiotics. She is still vaping. She report that she has mold in a part of her house that is not used much. Denies GI symptoms, urinary symptoms. LDCT on 06/04/19 showed severe centrilobular emphysema. Mild biapical pleuroparenchymal scarring. Pulmonary nodules measure 5.9 mm or less in size.  CXR showed hyperinflation and bronchitic changes  Office treatment: - Depo-medrol 60mg  IM x1  - Check POC   Recommendations: - Stop Symbicort and Incruse - Start tomorrow- take two puffs morning and evening (use aerochamber) - Use 2L oxygen continuously to keep O2 >88-90%   - Continue Levaquin for 2 more days (discuss further abx with MR on Monday) - Stay on 20mg  prednisone daily until follow-up   Orders: - Needs labs either this weekend or Monday (D-dimer, BNP, CBC with diff, BMET) - Please provide patient with Aerochamber if she does not have one   Follow-up: - Monday televisit with MR   OV 11/28/2020  Subjective:  Patient ID: Thursday, female , DOB: [birth year] , age 3 y.o. , MRN: 10/26/1960 , ADDRESS: [address] [place] [id number] [place], [place] Patient Care Team: [id number], MD as PCP - General (Family Medicine)  This Provider for this visit: Treatment Team:   Attending Provider: Aida Puffer, MD  Type of visit: Telephone/Video Circumstance: COVID-19 national emergency Identification of patient Sonia Bush with [birth year] and MRN Sonia Bush - 2 person identifier Risks: Risks, benefits, limitations of telephone visit explained. Patient understood and verbalized agreement to proceed Anyone else on call: none Patient location: at home This provider location: [address] [place] [phone]   11/28/2020 - AECOPD followup   HPI Sonia Bush 60 y.o. - finished steroid yestetday. Last day abx today.  Still some better only. Still unable to take shower. Feels better than few weeks ago. Says has o2 tank (thought small is heavy) sinc 11/25/20. Says is tripping her o2 tubing due to home set up of "intermittent steps". She lives in a trailer home. Every room has steps and even getting out.  Lives alone. Fell yesterday Bruised left knee "prettr bad: and foot is sore. Says [employer] company has smaller machines and wants prescriptions  Says she is fine with 2L [place]. Says room air she is fine but when she gets up and exerts  On BREZTRI now - she likes it  Has completed prednisone - 11/21/20 through 11/28/20 -> but feels she needs more  She hs blood work scheduled today but is reluctant to come due to fact she is worried about she wil trip on o2 but she is aware that without blood work data risk is on her. Advised her she can do  blood work at office and that data will be helpful for [place]. If getting worse to go to ER   CAT Score 12/14/2019 12/10/2018 02/03/2018  Total CAT Score 22 22 24          CT Chest data  DG Chest 2 View  Result Date: 11/25/2020 CLINICAL DATA:  Cough and low-grade fever.  History of COPD. EXAM: CHEST - 2 VIEW COMPARISON:  12/06/2016 FINDINGS: Lungs are hyperinflated. There is perihilar peribronchial thickening. There are no focal consolidations or pleural effusions. No pulmonary edema. IMPRESSION: Hyperinflation and bronchitic  changes. Electronically Signed   By: 02/05/2017 M.D.   On: 11/25/2020 16:38      PFT  PFT Results Latest Ref Rng & Units 03/31/2015  FVC-Pre L 1.33  FVC-Predicted Pre % 45  FVC-Post L 1.97  FVC-Predicted Post % 68  Pre FEV1/FVC % % 39  Post FEV1/FCV % % 34  FEV1-Pre L 0.51  FEV1-Predicted Pre % 22  FEV1-Post L 0.66  DLCO uncorrected ml/min/mmHg 4.57  DLCO UNC% % 25  DLVA Predicted % 42      OV 01/10/2021  Subjective:  Patient ID: 03/12/2021, female , DOB: 12-19-1960 , age 21 y.o. , MRN: [id number] , ADDRESS: [address] [place] [address], MD Patient Care Team: Aida Puffer, MD as PCP - General (Family Medicine)  This Provider for this visit: Treatment Team:  Attending Provider: Aida Puffer, MD    01/10/2021 -   Chief Complaint  Patient presents with  . Follow-up    Reports improvement in shortness of breath, now mostly with activity. Concerned about weight loss.    Gold stage IV COPD based on 2016 PFT alpha-1 MM Recurrent exacerbations Last pulmonary function test 2016 Last CT scan 2020  HPI Sonia Bush 60 y.o. -returns for follow-up of advanced COPD.  Not seen her in a while because of the pandemic.  We have had telephone visits.  She has recurrent exacerbations.  We try triple MDI BREZTRI .77  But she stopped this because it caused blisters.  She is gone back to Symbicort and Incruse.  She uses oxygen with exertion but not at rest or at night.  Review of the chart indicates last PFT was in 2016 last CT scan 2020.  Do not see echocardiogram.  Do not see blood gas.  At this point in time she is stable but she is having a 10 pound weight loss because of her Metformin rechallenge.  She says she lost significant amount of weight a few years ago when she was on Metformin.  She is trying to meet her primary care physician and stop the Metformin.  Otherwise currently stable.     CAT Score 12/14/2019 12/10/2018 02/03/2018   Total CAT Score 22 22 24         PFT  PFT Results Latest Ref Rng & Units 03/31/2015  FVC-Pre L 1.33  FVC-Predicted Pre % 45  FVC-Post L 1.97  FVC-Predicted Post % 68  Pre FEV1/FVC % % 39  Post FEV1/FCV % % 34  FEV1-Pre L 0.51  FEV1-Predicted Pre % 22  FEV1-Post L 0.66  DLCO uncorrected ml/min/mmHg 4.57  DLCO UNC% % 25  DLVA Predicted % 42       has a past medical history of Anxiety, Asthmatic bronchitis, Chronic pain, COPD (chronic obstructive pulmonary disease) (HCC), Diabetes mellitus without complication (HCC), and Hypertension.   reports that she quit smoking about 3 years ago. Her smoking use  included cigarettes. She started smoking about 49 years ago. She has a 69.00 pack-year smoking history. She has never used smokeless tobacco.  Past Surgical History:  Procedure Laterality Date  . ECTOPIC PREGNANCY SURGERY    . TUBAL LIGATION      Allergies  Allergen Reactions  . Prevnar 13 [Pneumococcal 13-Val Conj Vacc]     Arm swelling    Immunization History  Administered Date(s) Administered  . Fluad Quad(high Dose 65+) 07/10/2020  . Influenza Split 08/01/2014, 07/27/2015  . Influenza,inj,Quad PF,6+ Mos 07/24/2016, 08/01/2017, 07/01/2018, 07/02/2019  . PFIZER(Purple Top)SARS-COV-2 Vaccination 12/17/2019, 01/11/2020, 09/07/2020  . Pneumococcal Conjugate-13 04/08/2015    Family History  Problem Relation Age of Onset  . Asthma Daughter   . Asthma Daughter   . Skin cancer Father   . Alzheimer's disease Father      Current Outpatient Medications:  .  albuterol (VENTOLIN HFA) 108 (90 Base) MCG/ACT inhaler, INHALE 2 PUFFS EVERY 6HRS AS NEEDED FOR WHEEZING/SHORTNESS OF BREATH, Disp: 8.5 each, Rfl: 2 .  clonazePAM (KLONOPIN) 1 MG tablet, Take 1 tablet (1 mg total) by mouth 2 (two) times daily. (Patient taking differently: Take 0.5 mg by mouth 2 (two) times daily.), Disp: 10 tablet, Rfl: 0 .  cloNIDine (CATAPRES) 0.2 MG tablet, Take 0.2 mg by mouth at bedtime. ,  Disp: , Rfl:  .  HYDROcodone-acetaminophen (NORCO) 10-325 MG tablet, Take 0.5 tablets by mouth every 6 (six) hours as needed for moderate pain., Disp: , Rfl:  .  INCRUSE ELLIPTA 62.5 MCG/INH AEPB, 1 puff daily., Disp: , Rfl:  .  MAGNESIUM PO, Take 200 mg by mouth every evening., Disp: , Rfl:  .  metFORMIN (GLUCOPHAGE) 500 MG tablet, Take 1 tablet by mouth 2 (two) times daily., Disp: , Rfl:  .  Multiple Vitamins-Minerals (MULTI FOR HER 50+ PO), Take 1 tablet by mouth daily., Disp: , Rfl:  .  Potassium (POTASSIMIN PO), Take 99 mg by mouth every evening., Disp: , Rfl:  .  SYMBICORT 160-4.5 MCG/ACT inhaler, INHALE 2 PUFFS INTO THE LUNGS IN THE MORNING AND AT BEDTIME., Disp: 10.2 each, Rfl: 3 .  Vitamin D, Ergocalciferol, (DRISDOL) 1.25 MG (50000 UNIT) CAPS capsule, Take 50,000 Units by mouth once a week., Disp: , Rfl:  .  Budeson-Glycopyrrol-Formoterol (BREZTRI AEROSPHERE) 160-9-4.8 MCG/ACT AERO, Inhale 2 puffs into the lungs in the morning and at bedtime. (Patient not taking: Reported on 01/10/2021), Disp: 5.9 g, Rfl: 0 No current facility-administered medications for this visit.  Facility-Administered Medications Ordered in Other Visits:  .  albuterol (PROVENTIL) (2.5 MG/3ML) 0.083% nebulizer solution 2.5 mg, 2.5 mg, Nebulization, Once, Kalman Shan, MD      Objective:   Vitals:   01/10/21 1434  BP: 110/68  Pulse: (!) 101  Temp: 97.9 F (36.6 C)  TempSrc: Temporal  SpO2: 97%  Weight: 92 lb (41.7 kg)  Height: 4' 11.5" (1.511 m)    Estimated body mass index is 18.27 kg/m as calculated from the following:   Height as of this encounter: 4' 11.5" (1.511 m).   Weight as of this encounter: 92 lb (41.7 kg).  @  [employer]   01/10/21 1434  Weight: 92 lb (41.7 kg)     Physical Exam  General: No distress. lean Neuro: Alert and Oriented x 3. GCS 15. Speech normal Psych: Pleasant Resp:  Barrel Chest - yes.  Wheeze - no, Crackles - no, No overt respiratory  distress CVS: Normal heart sounds. Murmurs - no Ext: Stigmata of Connective Tissue  Disease - no HEENT: Normal upper airway. PEERL +. No post nasal drip        Assessment:       ICD-10-CM   1. COPD, very severe (HCC)  J44.9   2. Chronic respiratory failure with hypoxia (HCC)  J96.11   3. Former smoker  Z87.891   4. Dyspnea and respiratory abnormality  R06.00    R06.89        Plan:     Patient Instructions     ICD-10-CM   1. COPD, very severe (HCC)  J44.9   2. Chronic respiratory failure with hypoxia (HCC)  J96.11   3. Former smoker  Z87.891   4. Dyspnea and respiratory abnormality  R06.00    R06.89     COPD itself appears stable although your are a frequent exacerbaor Current 10 pound weight loss is likely due to Metformin Too bad Breztri did not work - caused some blistering  Plan  -Continue Symbicort daily and Incruse daily  - list breztri in allergy -Albuterol as needed -Oxygen with exertion 2 L nasal cannula and as needed at night  -Do high-resolution CT chest without contrast in the next few to several weeks -Do spirometry pre and postbronchodilator and DLCO -Do echocardiogram [I do not see one in your chart] -Consider arterial blood gas   Follow-up -Return to see Dr. Marchelle Gearing or nurse practitioner in the next few weeks to few months but to discuss all the above results  -At follow-up we could consider Roflumilast for repeated COPD exacerbation prevention     SIGNATURE    Dr. Kalman Shan, M.D., F.C.C.P,  Pulmonary and Critical Care Medicine Staff Physician, [facility] Center Director - Interstitial Lung Disease  Program  Pulmonary Fibrosis [facility] at [facility] [place], [place], [address]  Pager: [phone], If no answer or between  15:00h - 7:00h: call [phone] Telephone: [phone]  3:04 PM 01/10/2021

## 2021-01-25 ENCOUNTER — Ambulatory Visit
Admission: RE | Admit: 2021-01-25 | Discharge: 2021-01-25 | Disposition: A | Discharge status: Home / Self Care | Payer: Medicare Other | Source: Ambulatory Visit | Attending: Internal Medicine | Admitting: Internal Medicine

## 2021-01-25 DIAGNOSIS — Z87891 Personal history of nicotine dependence: Secondary | ICD-10-CM

## 2021-01-25 DIAGNOSIS — J9611 Chronic respiratory failure with hypoxia: Secondary | ICD-10-CM

## 2021-01-25 DIAGNOSIS — J449 Chronic obstructive pulmonary disease, unspecified: Secondary | ICD-10-CM

## 2021-01-25 DIAGNOSIS — R0689 Other abnormalities of breathing: Secondary | ICD-10-CM

## 2021-01-25 DIAGNOSIS — R0600 Dyspnea, unspecified: Secondary | ICD-10-CM

## 2021-02-02 NOTE — Progress Notes (Signed)
Ct chest shows new scarring in RLL. Plan - complete workkup of April 2022 note and then return to see app

## 2021-02-14 ENCOUNTER — Ambulatory Visit (HOSPITAL_COMMUNITY): Payer: Medicare Other | Attending: Cardiology

## 2021-02-14 ENCOUNTER — Encounter (INDEPENDENT_AMBULATORY_CARE_PROVIDER_SITE_OTHER): Payer: Self-pay

## 2021-02-14 ENCOUNTER — Other Ambulatory Visit: Payer: Self-pay

## 2021-02-14 DIAGNOSIS — J9611 Chronic respiratory failure with hypoxia: Secondary | ICD-10-CM | POA: Diagnosis present

## 2021-02-14 DIAGNOSIS — Z87891 Personal history of nicotine dependence: Secondary | ICD-10-CM

## 2021-02-14 DIAGNOSIS — R0689 Other abnormalities of breathing: Secondary | ICD-10-CM | POA: Diagnosis present

## 2021-02-14 DIAGNOSIS — R0600 Dyspnea, unspecified: Secondary | ICD-10-CM

## 2021-02-14 DIAGNOSIS — J449 Chronic obstructive pulmonary disease, unspecified: Secondary | ICD-10-CM

## 2021-02-14 LAB — ECHOCARDIOGRAM COMPLETE
Area-P 1/2: 2.68 cm2
S' Lateral: 2.8 cm

## 2021-02-16 ENCOUNTER — Other Ambulatory Visit: Payer: Self-pay | Admitting: Internal Medicine

## 2021-02-16 DIAGNOSIS — J449 Chronic obstructive pulmonary disease, unspecified: Secondary | ICD-10-CM

## 2021-02-27 ENCOUNTER — Other Ambulatory Visit: Payer: Self-pay | Admitting: Internal Medicine

## 2021-03-15 ENCOUNTER — Other Ambulatory Visit: Payer: Self-pay | Admitting: Internal Medicine

## 2021-03-27 NOTE — Progress Notes (Signed)
Echo normal but for mild muscle stiffness on account of age and lung diseas. Ensure fu per recent OV

## 2021-04-13 ENCOUNTER — Other Ambulatory Visit: Payer: Self-pay | Admitting: Internal Medicine

## 2021-08-03 ENCOUNTER — Other Ambulatory Visit: Payer: Self-pay | Admitting: Internal Medicine

## 2021-08-03 DIAGNOSIS — J449 Chronic obstructive pulmonary disease, unspecified: Secondary | ICD-10-CM

## 2021-09-13 ENCOUNTER — Encounter: Payer: Self-pay | Admitting: Acute Care

## 2021-09-13 ENCOUNTER — Ambulatory Visit (INDEPENDENT_AMBULATORY_CARE_PROVIDER_SITE_OTHER): Payer: Medicare Other | Admitting: Acute Care

## 2021-09-13 ENCOUNTER — Ambulatory Visit (INDEPENDENT_AMBULATORY_CARE_PROVIDER_SITE_OTHER): Payer: Medicare Other | Admitting: Internal Medicine

## 2021-09-13 ENCOUNTER — Other Ambulatory Visit: Payer: Self-pay

## 2021-09-13 VITALS — BP 106/62 | HR 98 | Temp 98.2°F | Ht 59.5 in | Wt 89.0 lb

## 2021-09-13 DIAGNOSIS — R000 Tachycardia, unspecified: Secondary | ICD-10-CM

## 2021-09-13 DIAGNOSIS — R0689 Other abnormalities of breathing: Secondary | ICD-10-CM

## 2021-09-13 DIAGNOSIS — J9611 Chronic respiratory failure with hypoxia: Secondary | ICD-10-CM

## 2021-09-13 DIAGNOSIS — R634 Abnormal weight loss: Secondary | ICD-10-CM | POA: Diagnosis not present

## 2021-09-13 DIAGNOSIS — J449 Chronic obstructive pulmonary disease, unspecified: Secondary | ICD-10-CM

## 2021-09-13 DIAGNOSIS — Z87891 Personal history of nicotine dependence: Secondary | ICD-10-CM

## 2021-09-13 DIAGNOSIS — T50905A Adverse effect of unspecified drugs, medicaments and biological substances, initial encounter: Secondary | ICD-10-CM

## 2021-09-13 LAB — PULMONARY FUNCTION TEST
DL/VA % pred: 41 %
DL/VA: 1.79 ml/min/mmHg/L
DLCO cor % pred: 39 %
DLCO cor: 6.8 ml/min/mmHg
DLCO unc % pred: 39 %
DLCO unc: 6.8 ml/min/mmHg
FEF 25-75 Post: 0.26 L/sec
FEF 25-75 Pre: 0.37 L/sec
FEF2575-%Change-Post: -30 %
FEF2575-%Pred-Post: 12 %
FEF2575-%Pred-Pre: 18 %
FEV1-%Change-Post: -9 %
FEV1-%Pred-Post: 34 %
FEV1-%Pred-Pre: 38 %
FEV1-Post: 0.73 L
FEV1-Pre: 0.81 L
FEV1FVC-%Change-Post: -4 %
FEV1FVC-%Pred-Pre: 59 %
FEV6-%Change-Post: -8 %
FEV6-%Pred-Post: 60 %
FEV6-%Pred-Pre: 65 %
FEV6-Post: 1.59 L
FEV6-Pre: 1.73 L
FEV6FVC-%Change-Post: -2 %
FEV6FVC-%Pred-Post: 100 %
FEV6FVC-%Pred-Pre: 103 %
FVC-%Change-Post: -5 %
FVC-%Pred-Post: 59 %
FVC-%Pred-Pre: 63 %
FVC-Post: 1.64 L
FVC-Pre: 1.74 L
Post FEV1/FVC ratio: 45 %
Post FEV6/FVC ratio: 97 %
Pre FEV1/FVC ratio: 47 %
Pre FEV6/FVC Ratio: 100 %

## 2021-09-13 NOTE — Patient Instructions (Addendum)
It was good to see you today.  Your PFT's continue to show severe obstruction with decreased DLCO, but this was better than previous PFT's .  Incruse once daily and Symbicort 2 puffs am and PM Rinse mouth after use Albuterol for rescue We will send in a prescription for Ventolin Use as needed for breakthrough shortness of breath or wheezing. Wear oxygen at 2 L continuous and 3 L with pulsed oxygen. Saturation goals are > 88% at all times.  Refer to [facility] cardiology for racing heart that you notice after you eat.  Follow up with Sonia Bush in 3 months or sooner if you need [place] sooner. Follow up sooner if needed or seek emergency care.  Please get an oxygen saturation monitor so you can ensure saturations are > 88%. Wear your oxygen to keep your oxygen  sats > 88%. At all times

## 2021-09-13 NOTE — Progress Notes (Signed)
History of Present Illness Sonia Bush is a 60 y.o. female former smoker with Stage IV COPD, on triple therapy maintenance inhaler , with additional history including Anxiety, Asthmatic bronchitis, Chronic pain, , Diabetes mellitus without complication, and Hypertension.She is followed by Dr. Chase Bush. Maintenance Therapy  Incruse once daily and Symbicort 2 puffs am and PM Albuterol for rescue   09/13/2021 Pt. Presents for follow up after HRCT and PFT's . Additionally she is going to walk to see if she if her oxygen needs have changed. She had a full qualifying walk today. She needs 2 L continuous  oxygen and 3 L pulsed. PFT's today show continued Severe Obstructive Airways Disease with reversible component and severe reduction in dlco typical of emphysema. However her numbers are a bit better that the PFT's in 2016. DLCO today was 39%, on previous PFT in 2016 it was 25%. She feels use of maintenance therapy has helped.  We did discuss her HRCT which showed Mild fibrotic changes isolated in the right lower lobe, the appearance of which is most suggestive of an area of post infectious or inflammatory scarring, and less likely ILD.  She has had significant weight loss. She attributed this to Metformin she was prescribed by her PCP for her DM. She is no longer on Metformin or Breztri. She was started on Pioglitazone 15mg  #1 tablet daily and Pravastatin 10mg  #1 tablet daily . Her insurance will no longer pay for Sonia Bush. She will start using Sonia Bush. If there is an issue with this, we will appeal. She feels the delivery system is different.  She notes she has been having issues with her heart racing. Her pulse is regular now, I am worried that she is not wearing her oxygen when she needs to do so and her oxygen saturations drop, and her heart rate is reflecting that. . She did have a heart echo  01/2021, EF 60-65%/ LV with normal function/ Grade 1 diastolic dysfunction/ RV with normal function  and size, MV normal structure , no regurgitation/ no stenosis/ Inferior vena cava normal size/RAP 3 mm Hg. She does not use a saturation monitor to check her saturations, and I am concerned she does not sense when her sats drop. I have asked her to buy a sat monitor. I explained they are inexpensive at [facility]. . I explained she must wear her oxygen.  Test Results: Echo 01/2021 Left ventricular ejection fraction, by estimation, is 60 to 65%. The left ventricle has normal function. The left ventricle has no regional wall motion abnormalities. Left ventricular diastolic parameters are consistent with Grade I diastolic dysfunction (impaired relaxation). 2. Right ventricular systolic function is normal. The right ventricular size is normal. 3. The mitral valve is normal in structure. No evidence of mitral valve regurgitation. No evidence of mitral stenosis. 4. The aortic valve is tricuspid. Aortic valve regurgitation is not visualized. No aortic stenosis is present. 5. The inferior vena cava is normal in size with greater than 50% respiratory variability, suggesting right atrial pressure of 3 mmHg.    HRCT 01/26/2021 Mild fibrotic changes isolated in the right lower lobe, the appearance of which is most suggestive of an area of post infectious or inflammatory scarring. No definitive imaging findings to suggest interstitial lung disease otherwise noted on today's examination. Diffuse bronchial wall thickening with moderate to severe centrilobular and paraseptal emphysema; imaging findings compatible with reported clinical history of COPD. Aortic atherosclerosis.           CBC Latest  Ref Rng & Units 12/01/2020 12/01/2020 11/14/2011  WBC 4.0 - 10.5 K/uL 10.1 11.7(H) 14.6(H)  Hemoglobin 12.0 - 15.0 g/dL 14.6 13.7 14.7  Hematocrit 36.0 - 46.0 % 45.6 41.6 41.6  Platelets 150 - 400 K/uL 450(H) 448.0(H) 254    BMP Latest Ref Rng & Units 12/01/2020 12/01/2020 01/17/2016  Glucose 70 - 99 mg/dL  511(HH) 504(HH) 156(H)  BUN 8 - 23 mg/dL 22 23 23   Creatinine 0.44 - 1.00 mg/dL 0.82 0.76 0.83  Sodium 135 - 145 mmol/L 129(L) 128(L) 136  Potassium 3.5 - 5.1 mmol/L 4.7 4.3 3.5  Chloride 98 - 111 mmol/L 90(L) 91(L) 95(L)  CO2 22 - 32 mmol/L 25 30 34(H)  Calcium 8.9 - 10.3 mg/dL 10.1 10.1 10.4    BNP No results found for: BNP  ProBNP    Component Value Date/Time   PROBNP 18.0 12/01/2020 1508    PFT    Component Value Date/Time   FEV1PRE 0.81 09/13/2021 1406   FEV1POST 0.73 09/13/2021 1406   FVCPRE 1.74 09/13/2021 1406   FVCPOST 1.64 09/13/2021 1406   DLCOUNC 6.80 09/13/2021 1406   PREFEV1FVCRT 47 09/13/2021 1406   PSTFEV1FVCRT 45 09/13/2021 1406    No results found.   Past medical hx Past Medical History:  Diagnosis Date   Anxiety    Asthmatic bronchitis    Chronic pain    COPD (chronic obstructive pulmonary disease) (HCC)    Diabetes mellitus without complication ([facility]onia [facility])    Hypertension      Social History   Tobacco Use   Smoking status: Former    Packs/day: 1.50    Years: 46.00    Pack years: 69.00    Types: Cigarettes    Start date: 10    Quit date: 11/01/2017    Years since quitting: 3.8   Smokeless tobacco: Never  Vaping Use   Vaping Use: Every day   Substances: Nicotine  Substance Use Topics   Alcohol use: Not Currently    Alcohol/week: 14.0 standard drinks    Types: 14 Cans of beer per week   Drug use: No    Ms.Sonia Bush reports that she quit smoking about 3 years ago. Her smoking use included cigarettes. She started smoking about 49 years ago. She has a 69.00 pack-year smoking history. She has never used smokeless tobacco. She reports that she does not currently use alcohol after a past usage of about 14.0 standard drinks per week. She reports that she does not use drugs.  Tobacco Cessation: Former smoker , quit 2019 with a 69 pack year smoking history   Past surgical hx, Family hx, Social hx all reviewed.  Current Outpatient  Medications on File Prior to Visit  Medication Sig   albuterol (Sonia Bush HFA) 108 (90 Base) MCG/ACT inhaler INHALE 2 PUFFS EVERY 6HRS AS NEEDED FOR WHEEZING/SHORTNESS OF BREATH   budesonide-formoterol (SYMBICORT) 160-4.5 MCG/ACT inhaler INHALE 2 PUFFS BY MOUTH INTO THE LUNGS IN THE MORNING AND AT BEDTIME.   clonazePAM (KLONOPIN) 1 MG tablet Take 1 tablet (1 mg total) by mouth 2 (two) times daily. (Patient taking differently: Take 0.5 mg by mouth 2 (two) times daily.)   cloNIDine (CATAPRES) 0.2 MG tablet Take 0.2 mg by mouth at bedtime.    HYDROcodone-acetaminophen (NORCO) 10-325 MG tablet Take 0.5 tablets by mouth every 6 (six) hours as needed for moderate pain.   MAGNESIUM PO Take 200 mg by mouth every evening.   Multiple Vitamins-Minerals (MULTI FOR HER 50+ PO) Take 1 tablet by mouth daily.  pioglitazone (ACTOS) 15 MG tablet Take by mouth.   Potassium (POTASSIMIN PO) Take 99 mg by mouth every evening.   pravastatin (PRAVACHOL) 10 MG tablet Take 10 mg by mouth daily.   umeclidinium bromide (INCRUSE ELLIPTA) 62.5 MCG/ACT AEPB INHALE 1 PUFF BY MOUTH EVERY DAY   Vitamin D, Ergocalciferol, (DRISDOL) 1.25 MG (50000 UNIT) CAPS capsule Take 50,000 Units by mouth once a week.   Current Facility-Administered Medications on File Prior to Visit  Medication   albuterol (PROVENTIL) (2.5 MG/3ML) 0.083% nebulizer solution 2.5 mg     Allergies  Allergen Reactions   Prevnar 13 [Pneumococcal 13-Val Conj Vacc]     Arm swelling    Review Of Systems:  Constitutional:   + weight loss, No night sweats,  Fevers, chills, +fatigue, or  lassitude.  HEENT:   No headaches,  Difficulty swallowing,  Tooth/dental problems, or  Sore throat,                No sneezing, itching, ear ache, nasal congestion, post nasal drip,   CV:  No chest pain,  Orthopnea, PND, swelling in lower extremities, anasarca, dizziness, + palpitations, syncope.   GI  No heartburn, indigestion, abdominal pain, nausea, vomiting, diarrhea,  change in bowel habits, loss of appetite, bloody stools.   Resp: + shortness of breath with exertion less at rest.  No excess mucus, no productive cough,  No non-productive cough,  No coughing up of blood.  No change in color of mucus.  No wheezing.  No chest wall deformity  Skin: no rash or lesions.  GU: no dysuria, change in color of urine, no urgency or frequency.  No flank pain, no hematuria   MS:  No joint pain or swelling.  No decreased range of motion.  No back pain.  Psych:  No change in mood or affect. No depression or anxiety.  No memory loss.   Vital Signs BP 106/62 (BP Location: Right Arm, Patient Position: Sitting, Cuff Size: Normal)    Pulse 98    Temp 98.2 F (36.8 C) (Oral)    Ht 4' 11.5" (1.511 m)    Wt 89 lb (40.4 kg)    SpO2 94%    BMI 17.67 kg/m    Physical Exam:  General- No distress,  A&Ox3, appropriate, frail appearing ENT: No sinus tenderness, TM clear, pale nasal mucosa, no oral exudate,no post nasal drip, no LAN Cardiac: S1, S2, regular rate and rhythm, no murmur Chest: + wheeze/ No rales/ dullness; no accessory muscle use, no nasal flaring, no sternal retractions Abd.: Soft Non-tender, ND, BS +, Body mass index is 17.67 kg/m.  Ext: No clubbing cyanosis, edema, no obvious deformities Neuro:  normal strength, MAE x 4, A&Ox 3, appropriate Skin: No rashes, warm and dry, NO lesions Psych: normal mood and behavior   Assessment/Plan Severe COPD/ severe Emphysema Chronic respiratory failure with hypoxemia Weight loss Intermittent Racing heart  Plan We have walked you today, and re-qualified you for your oxygen. Your PFT's continue to show severe obstruction with decreased DLCO, but this was better than previous PFT's .  Incruse once daily and Symbicort 2 puffs am and PM Rinse mouth after use Albuterol for rescue We will send in a prescription for Sonia Bush Use as needed for breakthrough shortness of breath or wheezing. Wear oxygen at 2 L continuous and  3 L with pulsed oxygen. Saturation goals are > 88% at all times.  Refer to [facility] cardiology for racing heart that you notice after you eat.  Follow up with Dr. Chase Bush in 3 months or sooner if you need [place] sooner. Follow up sooner if needed or seek emergency care.  Please get an oxygen saturation monitor so you can ensure saturations are > 88%. Wear your oxygen to keep your oxygen  sats > 88%. At all times Please work on weight gain Consider Glucerna Supplements Follow up with PCP re: weight loss and DM management   I spent 45 minutes dedicated to the care of this patient on the date of this encounter to include pre-visit review of records, face-to-face time with the patient discussing conditions above, post visit ordering of testing, clinical documentation with the electronic health record, making appropriate referrals as documented, and communicating necessary information to the patient's healthcare team.   Magdalen Spatz, NP 09/13/2021  6:44 PM

## 2021-09-13 NOTE — Progress Notes (Addendum)
Spiromety pre/post/DLCO performed today.

## 2021-09-13 NOTE — Patient Instructions (Addendum)
Spirometry pre/post /DLCO performed.

## 2021-10-13 ENCOUNTER — Telehealth: Payer: Self-pay | Admitting: Acute Care

## 2021-10-13 DIAGNOSIS — R000 Tachycardia, unspecified: Secondary | ICD-10-CM

## 2021-10-13 NOTE — Telephone Encounter (Signed)
Spoke with the pt  She was supposed to be referred to cards on 09/13/21 ov with SG  Referral never got placed  I put one in and marked it urgent  Nothing further needed per pt

## 2021-10-17 ENCOUNTER — Encounter: Payer: Self-pay | Admitting: Internal Medicine

## 2021-10-17 ENCOUNTER — Ambulatory Visit (INDEPENDENT_AMBULATORY_CARE_PROVIDER_SITE_OTHER): Payer: Medicare HMO | Admitting: Internal Medicine

## 2021-10-17 ENCOUNTER — Ambulatory Visit (INDEPENDENT_AMBULATORY_CARE_PROVIDER_SITE_OTHER): Payer: Medicare HMO

## 2021-10-17 ENCOUNTER — Other Ambulatory Visit: Payer: Self-pay

## 2021-10-17 VITALS — BP 102/72 | HR 88 | Ht 59.5 in | Wt 89.8 lb

## 2021-10-17 DIAGNOSIS — E119 Type 2 diabetes mellitus without complications: Secondary | ICD-10-CM | POA: Diagnosis not present

## 2021-10-17 DIAGNOSIS — I739 Peripheral vascular disease, unspecified: Secondary | ICD-10-CM

## 2021-10-17 DIAGNOSIS — J449 Chronic obstructive pulmonary disease, unspecified: Secondary | ICD-10-CM

## 2021-10-17 DIAGNOSIS — R002 Palpitations: Secondary | ICD-10-CM

## 2021-10-17 DIAGNOSIS — E785 Hyperlipidemia, unspecified: Secondary | ICD-10-CM

## 2021-10-17 MED ORDER — PANTOPRAZOLE SODIUM 40 MG PO TBEC: 40.0000 mg | DELAYED_RELEASE_TABLET | Freq: Every day | ORAL | 11 refills | Status: DC

## 2021-10-17 MED ORDER — ASPIRIN EC 81 MG PO TBEC: 81.0000 mg | DELAYED_RELEASE_TABLET | Freq: Every day | ORAL | 3 refills | Status: AC

## 2021-10-17 MED ORDER — EMPAGLIFLOZIN 10 MG PO TABS: 10.0000 mg | ORAL_TABLET | Freq: Every day | ORAL | 11 refills | Status: DC

## 2021-10-17 NOTE — Patient Instructions (Addendum)
Medication Instructions:  Your physician has recommended you make the following change in your medication:   START Jardiance 10 mg taking 1 daily  START Pantoprazole 40 mg taking 1 daily  START Aspirin 81 mg taking 1 daily  *If you need a refill on your cardiac medications before your next appointment, please call your pharmacy*   Lab Work: TODAY:  TSH RFX ON ABNORMAL TO FREE T4  If you have labs (blood work) drawn today and your tests are completely normal, you will receive your results only by: MyChart Message (if you have MyChart) OR A paper copy in the mail If you have any lab test that is abnormal or we need to change your treatment, we will call you to review the results.   Testing/Procedures: Your physician has requested that you have a lower extremity arterial exercise duplex with ABI's. During this test, exercise and ultrasound are used to evaluate arterial blood flow in the legs. Allow one hour for this exam. There are no restrictions or special instructions.  ZIO XT- Long Term Monitor Instructions  Your physician has requested you wear a ZIO patch monitor for 7 days.  This is a single patch monitor. [employer] supplies one patch monitor per enrollment. Additional stickers are not available. Please do not apply patch if you will be having a Nuclear Stress Test,  Echocardiogram, Cardiac CT, MRI, or Chest Xray during the period you would be wearing the  monitor. The patch cannot be worn during these tests. You cannot remove and re-apply the  ZIO XT patch monitor.  Your ZIO patch monitor will be mailed 3 day USPS to your address on file. It may take 3-5 days  to receive your monitor after you have been enrolled.  Once you have received your monitor, please review the enclosed instructions. Your monitor  has already been registered assigning a specific monitor serial # to you.  Billing and Patient Assistance Program Information  We have supplied [employer] with any of your  insurance information on file for billing purposes. [employer] offers a sliding scale Patient Assistance Program for patients that do not have  insurance, or whose insurance does not completely cover the cost of the ZIO monitor.  You must apply for the Patient Assistance Program to qualify for this discounted rate.  To apply, please call [employer] at [phone], select option 4, select option 2, ask to apply for  Patient Assistance Program. Meredeth Ide will ask your household income, and how many people  are in your household. They will quote your out-of-pocket cost based on that information.  [employer] will also be able to set up a 51-month, interest-free payment plan if needed.  Applying the monitor   Shave hair from upper left chest.  Hold abrader disc by orange tab. Rub abrader in 40 strokes over the upper left chest as  indicated in your monitor instructions.  Clean area with 4 enclosed alcohol pads. Let dry.  Apply patch as indicated in monitor instructions. Patch will be placed under collarbone on left  side of chest with arrow pointing upward.  Rub patch adhesive wings for 2 minutes. Remove white label marked "1". Remove the white  label marked "2". Rub patch adhesive wings for 2 additional minutes.  While looking in a mirror, press and release button in center of patch. A small green light will  flash 3-4 times. This will be your only indicator that the monitor has been turned on.  Do not shower for the first 24 hours.  You may shower after the first 24 hours.  Press the button if you feel a symptom. You will hear a small click. Record Date, Time and  Symptom in the Patient Logbook.  When you are ready to remove the patch, follow instructions on the last 2 pages of Patient  Logbook. Stick patch monitor onto the last page of Patient Logbook.  Place Patient Logbook in the blue and white box. Use locking tab on box and tape box closed  securely. The blue and white box has prepaid postage on  it. Please place it in the mailbox as  soon as possible. Your physician should have your test results approximately 7 days after the  monitor has been mailed back to [facility].  Call [facility] Customer Care at [phone] if you have questions regarding  your ZIO XT patch monitor. Call them immediately if you see an orange light blinking on your  monitor.  If your monitor falls off in less than 4 days, contact our Monitor department at [phone].  If your monitor becomes loose or falls off after 4 days call [employer] at [phone] for  suggestions on securing your monitor     Follow-Up: At [facility], you and your health needs are our priority.  As part of our continuing mission to provide you with exceptional heart care, we have created designated Provider Care Teams.  These Care Teams include your primary Cardiologist (physician) and Advanced Practice Providers (APPs -  Physician Assistants and Nurse Practitioners) who all work together to provide you with the care you need, when you need it.  We recommend signing up for the patient portal called "MyChart".  Sign up information is provided on this After Visit Summary.  MyChart is used to connect with patients for Virtual Visits (Telemedicine).  Patients are able to view lab/test results, encounter notes, upcoming appointments, etc.  Non-urgent messages can be sent to your provider as well.   To learn more about what you can do with MyChart, go to [email or web address].    Your next appointment:   6 month(s)  The format for your next appointment:   In Person  Provider:   Orbie Pyo, MD     Other Instructions

## 2021-10-17 NOTE — Progress Notes (Signed)
Cardiology Office Note:    Date:  10/17/2021   ID:  Sonia Bush, DOB 07-Jan-1961, MRN [id number]  PCP:  Tamsen Roers, MD   [facility] Providers Cardiologist:  Lenna Sciara, MD Referring MD: Magdalen Spatz, NP   Chief Complaint/Reason for Referral:  Palpitations   ASSESSMENT:    Palpitations  Type 2 diabetes mellitus without complication, without long-term current use of insulin ([place])  Hyperlipidemia, unspecified hyperlipidemia type  COPD, very severe ([place])  Claudication in peripheral vascular disease ([place])    PLAN:    In order of problems listed above:  1.  We will place a ZIO monitor.  I have a feeling this is likely underlying sinus tachycardia given her severe COPD however atrial fibrillation will need to be excluded.  If she has atrial fibrillation then anticoagulation may be required.  Will check reflex TSH.  Follow up in 6 months.  2.  We will start Jardiance 10 mg given diastolic dysfunction.  Start aspirin 81 mg given her history of diabetes and aortic atherosclerosis seen on CT scan.  Considering losartan 25 mg later given her history of diabetes.  Continue statin.  3.  Continue statin; LDL goal is less than 70.  This is being followed by her primary care provider.  4.  This is being followed by pulmonology.  5.  Will obtain ABIs due to RLE claudication symptoms.              Dispo:  No follow-ups on file.     Medication Adjustments/Labs and Tests Ordered: Current medicines are reviewed at length with the patient today.  Concerns regarding medicines are outlined above.   Tests Ordered: No orders of the defined types were placed in this encounter.   Medication Changes: No orders of the defined types were placed in this encounter.   History of Present Illness:    FOCUSED CARDIOVASCULAR PROBLEM LIST:   1.  Stage IV COPD  2.  Type 2 diabetes 3.  Hypertension 4.  Hyperlipidemia 5.  Diastolic dysfunction   The patient is a 61  y.o. female with the indicated medical history here for palpitations and heart racing.  The patient tells me that she was seen in the emergency department several months ago for elevated blood sugar.  This was treated and she was discharged.  Since that time she noticed racing heart rates particularly at rest.  She not sure if this coincided with the need for supplemental oxygen due to her COPD.  Over the last few months this feeling has subsided.  Maybe now it happens once or twice a week and is not associated with severe shortness of breath or chest pain.  She denies any presyncope or syncope.  She tells me on occasion she will develop calf soreness when walking.  Occasionally she has rest symptoms.  She had been on metformin previously and lost a lot of weight and so this was stopped.  She is currently on pioglitazone.  She has required no hospitalizations or emergency room visits.        Previous Medical History: Past Medical History:  Diagnosis Date   Anxiety    Asthmatic bronchitis    Chronic pain    COPD (chronic obstructive pulmonary disease) (HCC)    Diabetes mellitus without complication ([place])    Hypertension    Racing heart beat      Current Medications: Current Meds  Medication Sig   albuterol (VENTOLIN HFA) 108 (90 Base) MCG/ACT inhaler INHALE 2  PUFFS EVERY 6HRS AS NEEDED FOR WHEEZING/SHORTNESS OF BREATH   betamethasone dipropionate 0.05 % lotion Apply topically.   Blood Glucose Monitoring Suppl (CONTOUR NEXT EZ) w/Device KIT by Does not apply route.   budesonide-formoterol (SYMBICORT) 160-4.5 MCG/ACT inhaler INHALE 2 PUFFS BY MOUTH INTO THE LUNGS IN THE MORNING AND AT BEDTIME.   clonazePAM (KLONOPIN) 1 MG tablet Take 1 tablet (1 mg total) by mouth 2 (two) times daily. (Patient taking differently: Take 0.5 mg by mouth 2 (two) times daily.)   cloNIDine (CATAPRES) 0.2 MG tablet Take 0.2 mg by mouth at bedtime.    HYDROcodone-acetaminophen (NORCO) 10-325 MG tablet Take 0.5 tablets  by mouth every 6 (six) hours as needed for moderate pain.   MAGNESIUM PO Take 200 mg by mouth every evening.   Multiple Vitamins-Minerals (MULTI FOR HER 50+ PO) Take 1 tablet by mouth daily.   pioglitazone (ACTOS) 15 MG tablet Take by mouth.   Potassium (POTASSIMIN PO) Take 99 mg by mouth every evening.   pravastatin (PRAVACHOL) 10 MG tablet Take 10 mg by mouth daily.   umeclidinium bromide (INCRUSE ELLIPTA) 62.5 MCG/ACT AEPB INHALE 1 PUFF BY MOUTH EVERY DAY   Vitamin D, Ergocalciferol, (DRISDOL) 1.25 MG (50000 UNIT) CAPS capsule Take 50,000 Units by mouth once a week.     Allergies:    Prevnar 13 [pneumococcal 13-val conj vacc]   Social History:   Social History   Tobacco Use   Smoking status: Former    Packs/day: 1.50    Years: 46.00    Pack years: 69.00    Types: Cigarettes    Start date: 1973    Quit date: 11/01/2017    Years since quitting: 3.9   Smokeless tobacco: Never  Vaping Use   Vaping Use: Every day   Substances: Nicotine  Substance Use Topics   Alcohol use: Not Currently    Alcohol/week: 14.0 standard drinks    Types: 14 Cans of beer per week   Drug use: No     Family Hx: Family History  Problem Relation Age of Onset   Asthma Daughter    Asthma Daughter    Skin cancer Father    Alzheimer's disease Father      Review of Systems:   Please see the history of present illness.    All other systems reviewed and are negative.     EKGs/Labs/Other Test Reviewed:    EKG:  EKG today:sinus rhythm; prior EKG: Sinus tachycardia with possible anterior infarct  Prior CV studies:  TTE 5/22 1. Left ventricular ejection fraction, by estimation, is 60 to 65%. The  left ventricle has normal function. The left ventricle has no regional  wall motion abnormalities. Left ventricular diastolic parameters are  consistent with Grade I diastolic  dysfunction (impaired relaxation).   2. Right ventricular systolic function is normal. The right ventricular  size is normal.    3. The mitral valve is normal in structure. No evidence of mitral valve  regurgitation. No evidence of mitral stenosis.   4. The aortic valve is tricuspid. Aortic valve regurgitation is not  visualized. No aortic stenosis is present.   5. The inferior vena cava is normal in size with greater than 50%  respiratory variability, suggesting right atrial pressure of 3 mmHg.   Imaging studies that I have independently reviewed today: Echocardiogram  Recent Labs: 12/01/2020: BUN 22; Creatinine, Ser 0.82; Hemoglobin 14.6; Platelets 450; Potassium 4.7; Pro B Natriuretic peptide (BNP) 18.0; Sodium 129   Recent Lipid Panel No  results found for: CHOL, TRIG, HDL, LDLCALC, LDLDIRECT  Risk Assessment/Calculations:           Physical Exam:    VS:  BP 102/72    Pulse 88    Ht 4' 11.5" (1.511 m)    Wt 89 lb 12.8 oz (40.7 kg)    SpO2 95%    BMI 17.83 kg/m    Wt Readings from Last 3 Encounters:  10/17/21 89 lb 12.8 oz (40.7 kg)  09/13/21 89 lb (40.4 kg)  01/10/21 92 lb (41.7 kg)    GENERAL:  No apparent distress, Aox3, cachectic HEENT:  No carotid bruits, +2 carotid impulses, no scleral icterus CAR: RRR no murmurs, gallops, rubs, or thrills RES:  Clear to auscultation bilaterally ABD:  Soft, nontender, nondistended, positive bowel sounds x 4 VASC:  +2 radial pulses, +2 carotid pulses NEURO:  CN 2-12 grossly intact; motor and sensory grossly intact PSYCH:  No active depression or anxiety EXT:  No edema, ecchymosis, or cyanosis  Signed, Early Osmond, MD  10/17/2021 2:21 PM    [address], [place], [place]  [address] Phone: [phone]; Fax: [phone]   Note:  This document was prepared using Dragon voice recognition software and may include unintentional dictation errors.

## 2021-10-17 NOTE — Progress Notes (Unsigned)
Enrolled patient for a 7 day Zio XT monitor to be mailed to patients home.  

## 2021-10-18 LAB — TSH RFX ON ABNORMAL TO FREE T4: TSH: 1.7 u[IU]/mL (ref 0.450–4.500)

## 2021-10-23 DIAGNOSIS — R002 Palpitations: Secondary | ICD-10-CM

## 2021-11-01 ENCOUNTER — Other Ambulatory Visit: Payer: Self-pay

## 2021-11-01 ENCOUNTER — Ambulatory Visit (HOSPITAL_COMMUNITY)
Admission: RE | Admit: 2021-11-01 | Discharge: 2021-11-01 | Disposition: A | Discharge status: Home / Self Care | Payer: Medicare HMO | Source: Ambulatory Visit | Attending: Internal Medicine | Admitting: Internal Medicine

## 2021-11-01 DIAGNOSIS — I739 Peripheral vascular disease, unspecified: Secondary | ICD-10-CM | POA: Insufficient documentation

## 2021-11-06 ENCOUNTER — Other Ambulatory Visit: Payer: Self-pay | Admitting: Internal Medicine

## 2021-11-06 ENCOUNTER — Encounter (HOSPITAL_COMMUNITY): Payer: Medicare HMO

## 2021-11-06 DIAGNOSIS — J449 Chronic obstructive pulmonary disease, unspecified: Secondary | ICD-10-CM

## 2021-11-07 ENCOUNTER — Telehealth: Payer: Self-pay | Admitting: Internal Medicine

## 2021-11-07 DIAGNOSIS — J449 Chronic obstructive pulmonary disease, unspecified: Secondary | ICD-10-CM

## 2021-11-08 MED ORDER — BUDESONIDE-FORMOTEROL FUMARATE 160-4.5 MCG/ACT IN AERO
INHALATION_SPRAY | RESPIRATORY_TRACT | 9 refills | Status: DC
Start: 2021-11-08 — End: 2022-11-13

## 2021-11-08 MED ORDER — INCRUSE ELLIPTA 62.5 MCG/ACT IN AEPB: INHALATION_SPRAY | RESPIRATORY_TRACT | 1 refills | Status: DC

## 2021-11-08 NOTE — Telephone Encounter (Signed)
I called to let patient know that refills were sent and I was unable to leave a message due to VM being full.

## 2021-11-15 ENCOUNTER — Ambulatory Visit (HOSPITAL_COMMUNITY)
Admission: RE | Admit: 2021-11-15 | Discharge: 2021-11-15 | Disposition: A | Discharge status: Home / Self Care | Payer: Medicare HMO | Source: Ambulatory Visit | Attending: Cardiology | Admitting: Cardiology

## 2021-11-15 ENCOUNTER — Other Ambulatory Visit: Payer: Self-pay

## 2021-11-15 DIAGNOSIS — I739 Peripheral vascular disease, unspecified: Secondary | ICD-10-CM | POA: Diagnosis present

## 2021-12-14 ENCOUNTER — Encounter: Payer: Self-pay | Admitting: *Deleted

## 2021-12-14 NOTE — Progress Notes (Signed)
Received copy of letter from Dr. Hartford Poli (cc'd to Dr. Ali Lowe).  Placed to be scanned into chart.  Letter indicated Dr. Hartford Poli has stopped patient's Jardiance because it can promote weight loss. I have updated the medication list.  ?

## 2021-12-25 ENCOUNTER — Telehealth: Payer: Self-pay | Admitting: Internal Medicine

## 2021-12-26 NOTE — Telephone Encounter (Signed)
Spoke to patient.  ?She stated that [employer] is needing oxygen re certification. ?According to records, patient was re qualified on 09/13/2021. ?Spoke to [place] with [employer]. She stated that she would pull this documentation.  ?Nothing further is needed at this time.  ? ?

## 2022-01-23 ENCOUNTER — Other Ambulatory Visit: Payer: Self-pay | Admitting: *Deleted

## 2022-01-23 DIAGNOSIS — Z122 Encounter for screening for malignant neoplasm of respiratory organs: Secondary | ICD-10-CM

## 2022-01-23 DIAGNOSIS — Z87891 Personal history of nicotine dependence: Secondary | ICD-10-CM

## 2022-02-13 ENCOUNTER — Ambulatory Visit (INDEPENDENT_AMBULATORY_CARE_PROVIDER_SITE_OTHER)
Admission: RE | Admit: 2022-02-13 | Discharge: 2022-02-13 | Disposition: A | Discharge status: Home / Self Care | Payer: Medicare HMO | Source: Ambulatory Visit | Attending: Acute Care | Admitting: Acute Care

## 2022-02-13 DIAGNOSIS — Z87891 Personal history of nicotine dependence: Secondary | ICD-10-CM

## 2022-02-13 DIAGNOSIS — Z122 Encounter for screening for malignant neoplasm of respiratory organs: Secondary | ICD-10-CM

## 2022-02-15 ENCOUNTER — Other Ambulatory Visit: Payer: Self-pay

## 2022-02-15 DIAGNOSIS — Z122 Encounter for screening for malignant neoplasm of respiratory organs: Secondary | ICD-10-CM

## 2022-02-15 DIAGNOSIS — Z87891 Personal history of nicotine dependence: Secondary | ICD-10-CM

## 2022-03-01 ENCOUNTER — Telehealth: Payer: Self-pay | Admitting: Internal Medicine

## 2022-03-01 NOTE — Telephone Encounter (Signed)
Spoke with the patient.  She still has concerns about the diabetes meds causing her wt loss but her glucose is not well controlled at this time and she found out from the cancer screening that she has more plaque than she thought so she wants to know if she should try Jardiance again.  I asked her to review w her endocrinologist to help get the best glucose control.  She has follow up with Richardson Dopp, PA-C next month.

## 2022-03-01 NOTE — Telephone Encounter (Signed)
Patient wants to know if she should go back on Jardiance, even through she was losing weight on it.  She wants to know if this is good treatment for Atherosclerotic.

## 2022-03-21 ENCOUNTER — Telehealth: Payer: Self-pay | Admitting: Internal Medicine

## 2022-03-21 NOTE — Telephone Encounter (Signed)
Patient was summoned to jury duty in August, but states that she does not think she can do it with her medical conditions. Patient would like a letter excusing her-  please advise, call back [phone].

## 2022-03-22 NOTE — Telephone Encounter (Signed)
Called patient back this afternoon and she states that she received a jury summons for August. Patient states that she can not do this due to her health. Patient is wanting a letter stating why she is medically unable to do jury duty.   Please advise MR

## 2022-03-23 ENCOUNTER — Encounter: Payer: Self-pay | Admitting: *Deleted

## 2022-03-23 NOTE — Telephone Encounter (Signed)
We need the jury letter so we can get her date and juror number that is required to do the letter. Called the pt and there was no answer, and unfortunately no option to leave msg. Will call back.

## 2022-04-15 NOTE — Progress Notes (Signed)
Cardiology Office Note:    Date:  04/16/2022   ID:  Ulyses Southward, DOB [birth year], MRN [id number]  PCP:  Tamsen Roers, MD   [facility] Cardiologist:  Early Osmond, MD     Referring MD: Tamsen Roers, MD   CC of left arm numbness that started one month ago and she is also here for a sixth month follow-up appointment.     History of Present Illness:    VIDALIA SERPAS is a 61 y.o. female with a hx of the following:  COPD, Stage 4 (Very Severe) (Noncompliance with O2 therapy) Former smoker (Quit: 11/01/2017, 3 pack-year hx) Nicotine abuse - vaping HTN T2DM Aortic atherosclerosis, coronary artery calcification seen on CT on 02/14/22 PVD (ABIs showed mild right lower extremity arterial disease with resting right ankle-brachial index. The resting left  ankle-brachial index indicated mild left lower extremity arterial disease). HLD Claudication Palpitations Diastolic dysfunction seen on Echo on 02/14/2021 Cachexia, Underweight. She was 92 lbs. in December 2022 was is now 86.8 lbs today.   She established care with Dr. Ali Lowe on 10/17/21. During her last visit, she presented with a CC of palpitations, feeling like her heart was racing. A Zio monitor showed no significant arthymias with a few PVCs noted. TSH WNL. On occasion she would notice calf soreness when she walked. ABIs ordered were mildly abnormal and showed mild right lower extremity arterial disease with resting right ankle-brachial index. The resting left ankle-brachial index indicated mild left lower extremity arterial disease. She follows Endocrinology who recently stopped her Jardiance on 12/06/2021 and Pulmonology.   Today she presents for her 6 month follow up. Has a CC of left arm numbness X 1 month along upper arm that feels like a "twinge" and comes and goes intermittently and notices it when she strokes her upper arm. Denies any recent trauma however she says this could be due to the fact  that she leans on that left arm when she watches TV. She denies any pain with this. Says she feels lightheaded when getting up in the morning but believes this is due to taking her BP medicine late at night and being woken up early in the morning by her cat. Denies checking her BP at home and does not own a BP cuff. Says her BP is normally good when it is checked at her appointments and says her BP is the least of her concerns today. Does admit to vaping nicotine. Denies any alcohol use or illicit drugs. Has lost more weight since she was last seen by cardiology in January. Says she is now eating whatever she can to keep weight on. Denies any chest pain, palpitations, syncope, bleeding, swelling in her extremities, orthopnea, or PND. Says she does bruise easily but says this is due to her thin skin and bumping into objects recently.  She is due for labs today but wants to defer this and have her new PCP get these when she has her physical.    Past Medical History:  Diagnosis Date   Anxiety    Asthmatic bronchitis    Chronic pain    COPD (chronic obstructive pulmonary disease) ([place])    Diabetes mellitus without complication ([place])    Hypertension    Racing heart beat     Past Surgical History:  Procedure Laterality Date   ECTOPIC PREGNANCY SURGERY     TUBAL LIGATION      Current Medications: Current Meds  Medication Sig   albuterol (VENTOLIN  HFA) 108 (90 Base) MCG/ACT inhaler INHALE 2 PUFFS EVERY 6HRS AS NEEDED FOR WHEEZING/SHORTNESS OF BREATH   aspirin EC 81 MG tablet Take 1 tablet (81 mg total) by mouth daily. Swallow whole.   betamethasone dipropionate 0.05 % lotion Apply topically.   Blood Glucose Monitoring Suppl (CONTOUR NEXT EZ) w/Device KIT by Does not apply route.   budesonide-formoterol (SYMBICORT) 160-4.5 MCG/ACT inhaler INHALE 2 PUFFS BY MOUTH INTO THE LUNGS IN THE MORNING AND AT BEDTIME.   clonazePAM (KLONOPIN) 1 MG tablet Take 1 tablet (1 mg total) by mouth 2 (two) times  daily. (Patient taking differently: Take 0.25 mg by mouth daily.)   cloNIDine (CATAPRES) 0.2 MG tablet Take 0.2 mg by mouth at bedtime.    HYDROcodone-acetaminophen (NORCO) 10-325 MG tablet Take 0.5 tablets by mouth every 6 (six) hours as needed for moderate pain.   MAGNESIUM PO Take 200 mg by mouth every evening.   Multiple Vitamins-Minerals (MULTI FOR HER 50+ PO) Take 1 tablet by mouth daily.   pantoprazole (PROTONIX) 40 MG tablet Take 1 tablet (40 mg total) by mouth daily.   pioglitazone (ACTOS) 15 MG tablet Take 30 mg by mouth daily.   Potassium (POTASSIMIN PO) Take 99 mg by mouth every evening.   pravastatin (PRAVACHOL) 10 MG tablet Take 10 mg by mouth daily.   umeclidinium bromide (INCRUSE ELLIPTA) 62.5 MCG/ACT AEPB INHALE 1 PUFF BY MOUTH EVERY DAY   Vitamin D, Ergocalciferol, (DRISDOL) 1.25 MG (50000 UNIT) CAPS capsule Take 50,000 Units by mouth once a week.     Allergies:   Prevnar 13 [pneumococcal 13-val conj vacc]   Social History   Socioeconomic History   Marital status: Married    Spouse name: Not on file   Number of children: Not on file   Years of education: Not on file   Highest education level: Not on file  Occupational History   Occupation: Unempolyed  Tobacco Use   Smoking status: Former    Packs/day: 1.50    Years: 46.00    Total pack years: 69.00    Types: Cigarettes    Start date: 67    Quit date: 11/01/2017    Years since quitting: 4.4   Smokeless tobacco: Never  Vaping Use   Vaping Use: Every day   Substances: Nicotine  Substance and Sexual Activity   Alcohol use: Not Currently    Alcohol/week: 14.0 standard drinks of alcohol    Types: 14 Cans of beer per week   Drug use: No   Sexual activity: Not Currently  Other Topics Concern   Not on file  Social History Narrative   Not on file   Social Determinants of Health   Financial Resource Strain: Not on file  Food Insecurity: Not on file  Transportation Needs: Not on file  Physical Activity: Not  on file  Stress: Not on file  Social Connections: Not on file     Family History: The patient's family history includes Alzheimer's disease in her father; Asthma in her daughter and daughter; Skin cancer in her father.  Family history is also positive for coronary artery disease and type 2 diabetes. She denies any family history of hyperlipidemia, MI, or hypertension.   ROS:   Review of Systems  Constitutional:  Positive for weight loss. Negative for chills, diaphoresis, fever and malaise/fatigue.  HENT:  Negative for congestion, ear discharge, ear pain, hearing loss, nosebleeds, sinus pain, sore throat and tinnitus.   Eyes:  Negative for blurred vision, double vision, photophobia,  pain, discharge and redness.  Respiratory:  Positive for shortness of breath. Negative for cough, hemoptysis, sputum production, wheezing and stridor.        Chronic with her COPD- has not worsened and not recent exacerbation  Cardiovascular:  Positive for claudication. Negative for chest pain, palpitations, orthopnea, leg swelling and PND.       Does have this but states this is not a concern to her today. Has not worsened since last visit.   Gastrointestinal:  Negative for abdominal pain, blood in stool, constipation, diarrhea, heartburn, melena, nausea and vomiting.  Genitourinary:  Negative for dysuria, flank pain, frequency, hematuria and urgency.  Musculoskeletal:  Negative for back pain, falls, joint pain, myalgias and neck pain.  Skin:  Negative for itching and rash.  Neurological:  Positive for dizziness and tingling. Negative for tremors, sensory change, speech change, focal weakness, seizures, loss of consciousness, weakness and headaches.       See HPI.   Endo/Heme/Allergies:  Negative for environmental allergies and polydipsia. Bruises/bleeds easily.       From recently bumping her arm and due to her thin skin  Psychiatric/Behavioral:  Positive for substance abuse. Negative for depression,  hallucinations, memory loss and suicidal ideas. The patient is not nervous/anxious and does not have insomnia.        Currently vaping nicotine  - see HPI.     Please see the history of present illness.    All other systems reviewed and are negative.  EKGs/Labs/Other Studies Reviewed:    The following studies were reviewed today:  EKG:  12 EKG was not ordered today.    CT scan Lung CA Screening Low Dose w/o CM on 02/13/2022: Cardiovascular: Atherosclerotic calcification of the aorta, aortic valve and coronary arteries. Heart size normal. No pericardial effusion.   Mediastinum/Nodes: No pathologically enlarged mediastinal or axillary lymph nodes. Hilar regions are difficult to definitively evaluate without IV contrast. Esophagus is grossly unremarkable.   Lungs/Pleura: Centrilobular emphysema. Scarring in the right middle lobe, right lower lobe and lingula. 5.7 mm apical right upper lobe nodule, unchanged. No suspicious pulmonary nodules. No pleural fluid. Airway is unremarkable.   Upper Abdomen: Visualized portions of the liver, adrenal glands, kidneys, spleen, pancreas, stomach and bowel are grossly unremarkable.   Musculoskeletal: No worrisome lytic or sclerotic lesions.   IMPRESSION: 1. Lung-RADS 2, benign appearance or behavior. Continue annual screening with low-dose chest CT without contrast in 12 months. 2. Aortic atherosclerosis (ICD10-I70.0). Coronary artery calcification. 3.  Emphysema (ICD10-J43.9).     Electronically Signed   By: Lorin Picket M.D.   On: 02/14/2022 15:02   Vascular US lower extremity arterial Duplex on 11/15/2021: Right: Minimum to moderate plaque noted in the common iliac artery.  Minimum plaque noted in the lower extremity.  There is no evidence of significant stenosis in the bilateral common and external iliac arteries, nor lower extremity arteries despite evidence of mild disease based on the arterial Doppler.  There is at least a two vessel  run-off via PTA and ATA.  Small caliber distal peroneal artery, with possible short segment occlusion with immediate reconstitution of flow, based on collaterals seen in the distal segment.   Left: Minimum to moderate plaque noted in the common iliac artery.  Minimum plaque noted in the lower extremity.  There is no evidence of significant stenosis in the bilateral common and external iliac arteries, nor lower extremity arteries despite evidence of mild disease based on the arterial Doppler.  Three vessel run-off.  Electronically signed by Ida Rogue MD on 11/15/2021 at 10:57:50 PM.    3-7 day Zio monitor results on 11/07/21: Patient had a min HR of 61 bpm, max HR of 150 bpm, and avg HR of 78 bpm. Predominant underlying rhythm was Sinus Rhythm.  EVENTS: Isolated SVEs were rare (<1.0%), SVE Couplets were rare (<1.0%), and no SVE Triplets were present. Isolated VEs were rare (<1.0%),  and no VE Couplets or VE Triplets were present.   Patient's symptoms correlated to normal sinus rhythm or artifact.   No atrial fibrillation, ventricular tachyarrhythmias, or bradyarrhythmias were detected.  ABIs on 11/01/21: Right: Resting right ankle-brachial index indicates mild right lower extremity arterial disease. The right toe-brachial index is normal.  Left: Resting left ankle-brachial index indicates mild left lower extremity arterial disease. The left toe-brachial index is normal.   2D Echo on 02/14/2021: Left ventricular ejection fraction, by estimation, is 60 to 65%. The left ventricle has normal function. The left ventricle has no regional wall motion abnormalities. Left ventricular diastolic parameters are consistent with Grade I diastolic dysfunction (impaired relaxation). 2. Right ventricular systolic function is normal. The right ventricular size is normal. 3. The mitral valve is normal in structure. No evidence of mitral valve regurgitation. No evidence of mitral stenosis. 4. The aortic  valve is tricuspid. Aortic valve regurgitation is not visualized. No aortic stenosis is present. 5. The inferior vena cava is normal in size with greater than 50% respiratory variability, suggesting right atrial pressure of 3 mmHg.  Recent Labs: 10/17/2021: TSH 1.700  Recent Lipid Panel No results found for: "CHOL", "TRIG", "HDL", "CHOLHDL", "VLDL", "LDLCALC", "LDLDIRECT"   Risk Assessment/Calculations:       Unable to calculate ASCVD risk score due to not having current labs.   CAT score: 12 points  Physical Exam:    VS:  BP 118/70   Pulse 82   Ht 4' 11.5" (1.511 m)   Wt 86 lb 12.8 oz (39.4 kg)   SpO2 92%   BMI 17.24 kg/m     Wt Readings from Last 3 Encounters:  04/16/22 86 lb 12.8 oz (39.4 kg)  10/17/21 89 lb 12.8 oz (40.7 kg)  09/13/21 89 lb (40.4 kg)     GEN: Thin, 61 year old female with cachexia in no acute distress HEENT: Normal NECK: No JVD; No carotid bruits CARDIAC: RRR, no murmurs, rubs, gallops RESPIRATORY:  Diminished and clear to auscultation without rales, wheezing or rhonchi  ABDOMEN: Soft, flat abdomen, non-tender, non-distended, Bowel sounds X 4 MUSCULOSKELETAL:  No edema; No deformity  SKIN: Warm and dry, small bruise noted on right lateral elbow NEUROLOGIC:  Alert and oriented x 3 PSYCHIATRIC:  Normal affect   ASSESSMENT:    1. COPD, very severe ([place])   2. Nicotine abuse   3. Diastolic dysfunction   4. Essential hypertension, benign   5. Type 2 diabetes mellitus without complication, without long-term current use of insulin ([place])   6. Aortic atherosclerosis ([place])   7. Coronary artery calcification   8. PVD (peripheral vascular disease) ([place])   9. Hyperlipidemia, unspecified hyperlipidemia type   10. Claudication in peripheral vascular disease ([place])   11. Cachexia ([place])   12. Underweight    PLAN:    In order of problems listed above:  Stage 4 COPD - chronic, stable Nicotine abuse - new CAT score today is 12. No recent exacerbations or  worsening symptoms. Continue to follow up with Pulmonology and continue current medication treatment. She has told me today  she is vaping nicotine. I performed motivational interviewing with her and encouraged her to quit. Will continue to reevaluate this.   3. Diastolic dysfunction - chronic, stable Last Echo on 01/2021 revealed left ventricular diastolic parameters are consistent with Grade I diastolic dysfunction (impaired relaxation). LVEF was 60-65%. Patient does not want to start on any BP medication I.e. Losartan to help with kidney function d/t her diabetes. No medication changes at this time as patient does not want to add any more blood pressure medications to her regimen.   4. HTN - chronic, stable Blood pressure is stable today at 118/70. Does admit to feeling lightheaded initially on awakening in the mornings but attributes this to taking her Clonidine late at night and waking up early in the morning. She denies any other symptoms of lightheadedness and states she is asymptomatic during the day. I educated her to take this earlier at night and to take her time getting up in the morning. I wrote her a Rx for a pediatric BP cuff to check her blood pressures at home and to let me know in Mychart in 2 weeks. However she said her blood pressure is the least of her concerns today. We will continue to monitor.Continue current regimen. Discussed to monitor BP at home at least 2 hours after medications and sitting for 5-10 minutes.   5. T2DM - chronic, stable This is being managed by her Endocrinologist who recently checked her A1C on 03/07/22 and it was 7.3%. Continue Actos. Will continue to monitor   6-7. Aortic atherosclerosis, coronary artery calcification- chronic, stable I went over her CT results that showed aortic atherosclerosis and coronary artery calcification. Stable with no anginal symptoms. No indication for ischemic evaluation. Continue pravastatin. Educated her on the importance of  Aspirin and told her to take Aspiring 81 mg PO daily.    8-10. PVD, HLD, claudication - chronic, stable Still notices claudication but this has not worsened since last visit. Her last ABIs were mildly abnormal and showed mild right lower extremity arterial disease with resting right ankle-brachial index. The resting left  ankle-brachial index indicated mild left lower extremity arterial disease. I told her that if she notices that her symptoms worsen to let us know and we can refer her to see our VVS. She is due for a CMET and Lipid panel today however she wants to defer this and have her PCP check all of her labs.    11-12. Cachexia, underweight - chronic, worsening This has been ongoing and she has lost close to 4 lbs since last December. Will refer patient to a Dietician who can help patient find healthy foods to help her keep on weight.     13. Disposition - F/U in 6 months in the office or sooner if needed.   Medication Adjustments/Labs and Tests Ordered: Current medicines are reviewed at length with the patient today.  Concerns regarding medicines are outlined above.  Orders Placed This Encounter  Procedures   Amb ref to Medical Nutrition Therapy-MNT   No orders of the defined types were placed in this encounter.   Patient Instructions  Medication Instructions:  Your physician recommends that you continue on your current medications as directed. Please refer to the Current Medication list given to you today.  *If you need a refill on your cardiac medications before your next appointment, please call your pharmacy*   Lab Work: None ordered  If you have labs (blood work) drawn today and your tests are completely normal,  you will receive your results only by: [facility] (if you have MyChart) OR A paper copy in the mail If you have any lab test that is abnormal or we need to change your treatment, we will call you to review the results.   Testing/Procedures: None  ordered You have been referred to DIETICIAN.  THEY WILL REACH OUT TO YOU WITH AN APPOINTMENT   Follow-Up: At [facility], you and your health needs are our priority.  As part of our continuing mission to provide you with exceptional heart care, we have created designated Provider Care Teams.  These Care Teams include your primary Cardiologist (physician) and Advanced Practice Providers (APPs -  Physician Assistants and Nurse Practitioners) who all work together to provide you with the care you need, when you need it.  We recommend signing up for the patient portal called "MyChart".  Sign up information is provided on this After Visit Summary.  MyChart is used to connect with patients for Virtual Visits (Telemedicine).  Patients are able to view lab/test results, encounter notes, upcoming appointments, etc.  Non-urgent messages can be sent to your provider as well.   To learn more about what you can do with MyChart, go to [email or web address].    Your next appointment:   6 month(s)  The format for your next appointment:   In Person  Provider:   Early Osmond, MD     Other Instructions Your physician has requested that you regularly monitor and record your blood pressure readings at home. Please use the same machine at the same time of day to check your readings and record them to bring to your follow-up visit.   Please monitor blood pressures and keep a log of your readings for 2 weeks then call me with those readings..    Make sure to check 2 hours after your medications.    AVOID these things for 30 minutes before checking your blood pressure: No Drinking caffeine. No Drinking alcohol. No Eating. No Smoking. No Exercising.   Five minutes before checking your blood pressure: Pee. Sit in a dining chair. Avoid sitting in a soft couch or armchair. Be quiet. Do not talk   Important Information About Sugar         Signed, Finis Bud, NP  04/16/2022 4:54 PM     [place]

## 2022-04-16 ENCOUNTER — Encounter: Payer: Self-pay | Admitting: Physician Assistant

## 2022-04-16 ENCOUNTER — Ambulatory Visit (INDEPENDENT_AMBULATORY_CARE_PROVIDER_SITE_OTHER): Payer: Medicare HMO | Admitting: Nurse Practitioner

## 2022-04-16 VITALS — BP 118/70 | HR 82 | Ht 59.5 in | Wt 86.8 lb

## 2022-04-16 DIAGNOSIS — R002 Palpitations: Secondary | ICD-10-CM

## 2022-04-16 DIAGNOSIS — J449 Chronic obstructive pulmonary disease, unspecified: Secondary | ICD-10-CM

## 2022-04-16 DIAGNOSIS — I10 Essential (primary) hypertension: Secondary | ICD-10-CM

## 2022-04-16 DIAGNOSIS — I739 Peripheral vascular disease, unspecified: Secondary | ICD-10-CM

## 2022-04-16 DIAGNOSIS — I2584 Coronary atherosclerosis due to calcified coronary lesion: Secondary | ICD-10-CM

## 2022-04-16 DIAGNOSIS — Z720 Tobacco use: Secondary | ICD-10-CM | POA: Diagnosis not present

## 2022-04-16 DIAGNOSIS — I700 Atherosclerosis of aorta: Secondary | ICD-10-CM

## 2022-04-16 DIAGNOSIS — R911 Solitary pulmonary nodule: Secondary | ICD-10-CM

## 2022-04-16 DIAGNOSIS — I2510 Atherosclerotic heart disease of native coronary artery without angina pectoris: Secondary | ICD-10-CM

## 2022-04-16 DIAGNOSIS — I5189 Other ill-defined heart diseases: Secondary | ICD-10-CM

## 2022-04-16 DIAGNOSIS — R64 Cachexia: Secondary | ICD-10-CM

## 2022-04-16 DIAGNOSIS — E119 Type 2 diabetes mellitus without complications: Secondary | ICD-10-CM

## 2022-04-16 DIAGNOSIS — R636 Underweight: Secondary | ICD-10-CM

## 2022-04-16 DIAGNOSIS — E785 Hyperlipidemia, unspecified: Secondary | ICD-10-CM

## 2022-04-16 NOTE — Patient Instructions (Signed)
Medication Instructions:  Your physician recommends that you continue on your current medications as directed. Please refer to the Current Medication list given to you today.  *If you need a refill on your cardiac medications before your next appointment, please call your pharmacy*   Lab Work: None ordered  If you have labs (blood work) drawn today and your tests are completely normal, you will receive your results only by: MyChart Message (if you have MyChart) OR A paper copy in the mail If you have any lab test that is abnormal or we need to change your treatment, we will call you to review the results.   Testing/Procedures: None ordered You have been referred to DIETICIAN.  THEY WILL REACH OUT TO YOU WITH AN APPOINTMENT   Follow-Up: At [facility], you and your health needs are our priority.  As part of our continuing mission to provide you with exceptional heart care, we have created designated Provider Care Teams.  These Care Teams include your primary Cardiologist (physician) and Advanced Practice Providers (APPs -  Physician Assistants and Nurse Practitioners) who all work together to provide you with the care you need, when you need it.  We recommend signing up for the patient portal called "MyChart".  Sign up information is provided on this After Visit Summary.  MyChart is used to connect with patients for Virtual Visits (Telemedicine).  Patients are able to view lab/test results, encounter notes, upcoming appointments, etc.  Non-urgent messages can be sent to your provider as well.   To learn more about what you can do with MyChart, go to [email or web address].    Your next appointment:   6 month(s)  The format for your next appointment:   In Person  Provider:   Orbie Pyo, MD     Other Instructions Your physician has requested that you regularly monitor and record your blood pressure readings at home. Please use the same machine at the same time of day to  check your readings and record them to bring to your follow-up visit.   Please monitor blood pressures and keep a log of your readings for 2 weeks then call me with those readings..    Make sure to check 2 hours after your medications.    AVOID these things for 30 minutes before checking your blood pressure: No Drinking caffeine. No Drinking alcohol. No Eating. No Smoking. No Exercising.   Five minutes before checking your blood pressure: Pee. Sit in a dining chair. Avoid sitting in a soft couch or armchair. Be quiet. Do not talk   Important Information About Sugar

## 2022-04-19 ENCOUNTER — Telehealth: Payer: Self-pay | Admitting: Internal Medicine

## 2022-04-19 NOTE — Telephone Encounter (Signed)
[employer] call stating a prior Berkley Harvey is going to be needed for the blood pressure (pediatric sleeve) cuff for the patient. Please follow up with patient. Provider services number [phone].

## 2022-04-20 NOTE — Telephone Encounter (Signed)
**Note De-Identified Sonia Bush Obfuscation** I can assist with Medication PAs but I do not handle these types of prior authorizations.

## 2022-04-20 NOTE — Telephone Encounter (Signed)
Pediatric BP cuff ordered by APP E. Peck on 04/16/22.

## 2022-04-20 NOTE — Telephone Encounter (Signed)
Unable to get through to [facility].  Will see if Prior Auth Nurse is able to assist.

## 2022-04-23 MED ORDER — BLOOD PRESSURE KIT: 1.0000 | PACK | Freq: Every day | 0 refills | Status: AC

## 2022-04-23 NOTE — Telephone Encounter (Signed)
Ordered placed and faxed to [employer] at [phone]

## 2022-05-23 ENCOUNTER — Other Ambulatory Visit: Payer: Self-pay | Admitting: Internal Medicine

## 2022-05-23 DIAGNOSIS — J449 Chronic obstructive pulmonary disease, unspecified: Secondary | ICD-10-CM

## 2022-10-08 ENCOUNTER — Other Ambulatory Visit: Payer: Self-pay | Admitting: Internal Medicine

## 2022-10-24 ENCOUNTER — Encounter: Payer: Self-pay | Admitting: Internal Medicine

## 2022-11-02 ENCOUNTER — Telehealth: Payer: Self-pay | Admitting: Internal Medicine

## 2022-11-02 NOTE — Telephone Encounter (Signed)
PT calling to get appt W/Dr. R to keep RX going.  Needs Symbicort and it needs pre-auth for her insurance.   She did not like Breztri because it made her tongue swell.  CB PT is [phone] to advise on time line and confirm this info. She will be gone until 4:00

## 2022-11-11 ENCOUNTER — Other Ambulatory Visit: Payer: Self-pay

## 2022-11-11 ENCOUNTER — Emergency Department (HOSPITAL_COMMUNITY)
Admission: EM | Admit: 2022-11-11 | Discharge: 2022-11-11 | Disposition: A | Discharge status: Home / Self Care | Payer: Medicare HMO | Attending: Emergency Medicine | Admitting: Emergency Medicine

## 2022-11-11 DIAGNOSIS — Z7982 Long term (current) use of aspirin: Secondary | ICD-10-CM | POA: Diagnosis not present

## 2022-11-11 DIAGNOSIS — R040 Epistaxis: Secondary | ICD-10-CM | POA: Insufficient documentation

## 2022-11-11 DIAGNOSIS — E119 Type 2 diabetes mellitus without complications: Secondary | ICD-10-CM | POA: Insufficient documentation

## 2022-11-11 DIAGNOSIS — Z7951 Long term (current) use of inhaled steroids: Secondary | ICD-10-CM | POA: Diagnosis not present

## 2022-11-11 DIAGNOSIS — Z7984 Long term (current) use of oral hypoglycemic drugs: Secondary | ICD-10-CM | POA: Diagnosis not present

## 2022-11-11 DIAGNOSIS — J449 Chronic obstructive pulmonary disease, unspecified: Secondary | ICD-10-CM | POA: Diagnosis not present

## 2022-11-11 MED ORDER — OXYMETAZOLINE HCL 0.05 % NA SOLN
1.0000 | Freq: Once | NASAL | Status: DC
  Filled 2022-11-11: qty 30

## 2022-11-11 MED ORDER — LIDOCAINE-EPINEPHRINE-TETRACAINE (LET) TOPICAL GEL
3.0000 mL | Freq: Once | TOPICAL | Status: AC
  Administered 2022-11-11: 3 mL via TOPICAL
  Filled 2022-11-11: qty 3

## 2022-11-11 NOTE — ED Triage Notes (Signed)
Pt reports nose bleed from right nostril on and off today about 4 times. Pt denies taking blood thinners. Pt has tissue in place with bleeding controlled.

## 2022-11-11 NOTE — ED Provider Notes (Signed)
[place] EMERGENCY DEPARTMENT AT [facility] Provider Note   CSN: [id number] Arrival date & time: 11/11/22  0036     History  Chief Complaint  Patient presents with   Epistaxis    Sonia Bush is a 62 y.o. female.  The history is provided by the patient and a relative.  Epistaxis Sonia Bush is a 62 y.o. female who presents to the Emergency Department complaining of epistaxis.  She presents to the emergency department for evaluation of intermittent epistaxis that started on Wednesday.  She reports a heavy episode on Wednesday and then once daily on Thursday and Friday.  Today she had 4 episodes of heavy nosebleeding.  1 time she had some blood out of her right eye and she had a down the back of her throat.  She has a history of COPD and diabetes.  She does have as needed oxygen available at home but has not used that recently.  She usually takes a baby aspirin daily but has not taken it since last Sunday.  No additional excessive bruising, bleeding.  No prior similar symptoms.  Former smoker - now vapes.  No alcohol.  No drugs.       Home Medications Prior to Admission medications   Medication Sig Start Date End Date Taking? Authorizing Provider  albuterol (VENTOLIN HFA) 108 (90 Base) MCG/ACT inhaler INHALE 2 PUFFS EVERY 6HRS AS NEEDED FOR WHEEZING/SHORTNESS OF BREATH Patient taking differently: Inhale 2 puffs into the lungs every 6 (six) hours as needed for wheezing or shortness of breath. INHALE 2 PUFFS EVERY 6HRS AS NEEDED FOR WHEEZING/SHORTNESS OF BREATH 03/17/21  Yes Brand Males, MD  aspirin EC 81 MG tablet Take 1 tablet (81 mg total) by mouth daily. Swallow whole. Patient taking differently: Take 81 mg by mouth daily as needed (swelling). Swallow whole. 10/17/21  Yes Early Osmond, MD  budesonide-formoterol (SYMBICORT) 160-4.5 MCG/ACT inhaler INHALE 2 PUFFS BY MOUTH INTO THE LUNGS IN THE MORNING AND AT BEDTIME. 11/08/21  Yes Brand Males, MD  clonazePAM (KLONOPIN) 1 MG tablet Take 1 tablet (1 mg total) by mouth 2 (two) times daily. Patient taking differently: Take 0.125-0.5 mg by mouth daily as needed for anxiety. 03/31/18  Yes Young, Tarri Fuller D, MD  cloNIDine (CATAPRES) 0.2 MG tablet Take 0.2 mg by mouth at bedtime.    Yes [provider]  HYDROcodone-acetaminophen (NORCO) 10-325 MG tablet Take 0.5-1 tablets by mouth every 6 (six) hours as needed for moderate pain.   Yes [provider]  INCRUSE ELLIPTA 62.5 MCG/ACT AEPB INHALE 1 PUFF BY MOUTH EVERY DAY Patient taking differently: Inhale 1 puff into the lungs daily. INHALE 1 PUFF BY MOUTH EVERY DAY 05/23/22  Yes Brand Males, MD  MAGNESIUM PO Take 200 mg by mouth every evening.   Yes [provider]  Multiple Vitamins-Minerals (MULTI FOR HER 50+ PO) Take 1 tablet by mouth daily.   Yes [provider]  pantoprazole (PROTONIX) 40 MG tablet TAKE 1 TABLET BY MOUTH EVERY DAY 10/08/22  Yes Early Osmond, MD  pioglitazone (ACTOS) 30 MG tablet Take 30 mg by mouth daily. 09/15/22  Yes [provider]  Potassium (POTASSIMIN PO) Take 99 mg by mouth every evening.   Yes [provider]  pravastatin (PRAVACHOL) 10 MG tablet Take 10 mg by mouth every evening. 08/31/21  Yes [provider]  Blood Glucose Monitoring Suppl (CONTOUR NEXT EZ) w/Device KIT by Does not apply route. 08/31/21   [provider]  Blood Pressure KIT 1 kit by Does not apply route daily. 04/23/22   Finis Bud, NP      Allergies    Judithann Sauger aerosphere [budeson-glycopyrrol-formoterol] and Prevnar 13 [pneumococcal 13-val conj vacc]    Review of Systems   Review of Systems  HENT:  Positive for nosebleeds.   All other systems reviewed and are negative.   Physical Exam Updated Vital Signs BP (!) 154/88   Pulse 98   Temp 97.7 F (36.5 C)   Resp 17   Ht 4' 11"$  (1.499 m)   Wt 39 kg   SpO2 92%   BMI 17.37 kg/m  Physical Exam Vitals  and nursing note reviewed.  Constitutional:      Appearance: She is well-developed.  HENT:     Head: Normocephalic.     Comments: There is an area of friability in the right anterior septum with stigmata of recent bleeding.  There is also some mild friability in the left anterior septum.    Mouth/Throat:     Mouth: Mucous membranes are moist.     Pharynx: No oropharyngeal exudate or posterior oropharyngeal erythema.  Eyes:     Extraocular Movements: Extraocular movements intact.  Cardiovascular:     Rate and Rhythm: Normal rate and regular rhythm.  Pulmonary:     Effort: Pulmonary effort is normal.     Breath sounds: Normal breath sounds.  Musculoskeletal:        General: No swelling or tenderness.  Skin:    General: Skin is warm and dry.  Neurological:     Mental Status: She is alert and oriented to person, place, and time.  Psychiatric:        Behavior: Behavior normal.     ED Results / Procedures / Treatments   Labs (all labs ordered are listed, but only abnormal results are displayed) Labs Reviewed  CBC WITH DIFFERENTIAL/PLATELET    EKG EKG Interpretation  Date/Time:  Sunday November 11 2022 00:46:37 EST Ventricular Rate:  118 PR Interval:  163 QRS Duration: 96 QT Interval:  310 QTC Calculation: 435 R Axis:   -55 Text Interpretation: Sinus tachycardia Consider right atrial enlargement LAD, consider left anterior fascicular block Baseline wander Confirmed by Quintella Reichert [phone]) on 11/11/2022 3:52:01 AM  Radiology No results found.  Procedures .Epistaxis Management  Date/Time: 11/11/2022 5:04 AM  Performed by: Quintella Reichert, MD Authorized by: Quintella Reichert, MD   Consent:    Consent obtained:  Verbal   Consent given by:  Patient   Risks discussed:  Bleeding, nasal injury and pain Universal protocol:    Patient identity confirmed:  Verbally with patient Anesthesia:    Anesthesia method:  Topical application   Topical anesthetic:  LET Procedure  details:    Treatment site:  R anterior   Treatment complexity:  Limited   Treatment episode: initial   Post-procedure details:    Assessment:  Bleeding stopped   Procedure completion:  Tolerated     Medications Ordered in ED Medications  oxymetazoline (AFRIN) 0.05 % nasal spray 1 spray (1 spray Each Nare Not Given 11/11/22 0317)  lidocaine-EPINEPHrine-tetracaine (LET) topical gel (3 mLs Topical Given 11/11/22 0425)    ED Course/ Medical Decision Making/ A&P                             Medical Decision Making Amount and/or Complexity of Data Reviewed Labs: ordered.  Risk OTC drugs.   Patient here  for evaluation of intermittent right-sided nosebleed for 4 days.  No additional bleeding per patient such as hematochezia or increased bruising.  She does have an area of friability with stigmata of recent bleeding in her right nare.  The area was pretreated with Afrin followed by topical lidocaine with epinephrine.  After removal of this medication patient was observed in the emergency department without recurrent bleeding.  Discussed home care for nasal bleeding.  Discussed outpatient follow-up and return precautions.  Given this is a new process for her and she is experienced 4 days of bleeding did recommend that she have a blood draw performed in the emergency department today to check her blood counts and she declines at this time.  Discussed that she should follow-up with her PCP closely for reevaluation.        Final Clinical Impression(s) / ED Diagnoses Final diagnoses:  Anterior epistaxis    Rx / DC Orders ED Discharge Orders     None         Quintella Reichert, MD 11/11/22 0505

## 2022-11-11 NOTE — Discharge Instructions (Signed)
Continue your afrin nasal spray, one spray in each nostril twice daily for a total of three days.

## 2022-11-12 ENCOUNTER — Other Ambulatory Visit (HOSPITAL_COMMUNITY): Payer: Self-pay

## 2022-11-12 NOTE — Telephone Encounter (Signed)
Per test claim generic Symbicort is covered with no PA needed at this time.

## 2022-11-13 MED ORDER — BUDESONIDE-FORMOTEROL FUMARATE 160-4.5 MCG/ACT IN AERO: INHALATION_SPRAY | RESPIRATORY_TRACT | 11 refills | Status: DC

## 2022-11-13 NOTE — Telephone Encounter (Signed)
Spoke with the pt and notified of response per pharm team  She just needed a refill sent to pharm  This has been sent and nothing further needed

## 2022-12-08 IMAGING — CT CT CHEST LUNG CANCER SCREENING LOW DOSE W/O CM
2 of 4 series · 15 of 36 positions shown, 18 images · non-contrast
Comparison: 01/25/2021.

CLINICAL DATA: Former smoker, quit 3998, 45 pack-year history.



[Series 3: lung thins 1.0 · axial · 0.63mm/px · z∈[-259,-0]mm · 12 of 285 slices shown, 15 images]
[im 13/285  mediastinal]
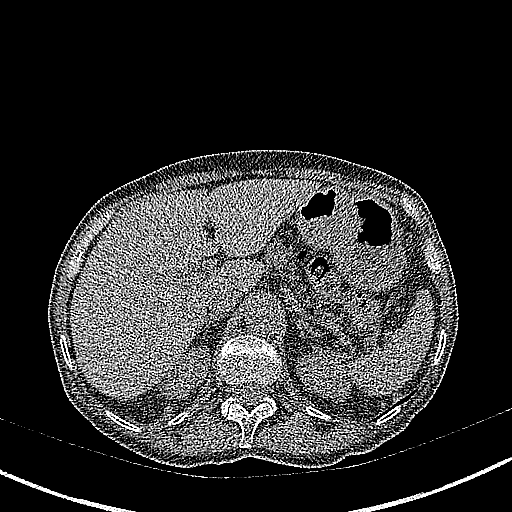
[im 13/285  lung]
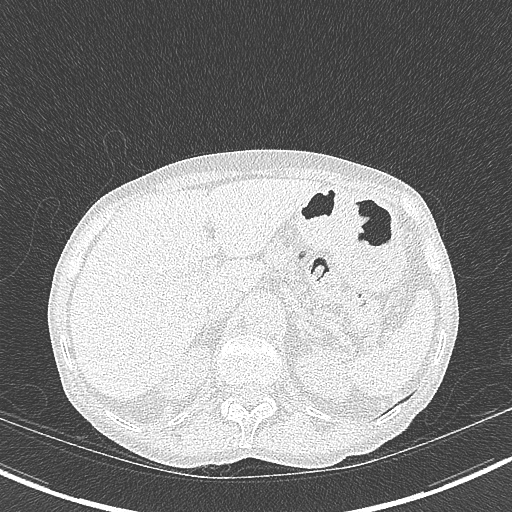
[im 39/285  lung]
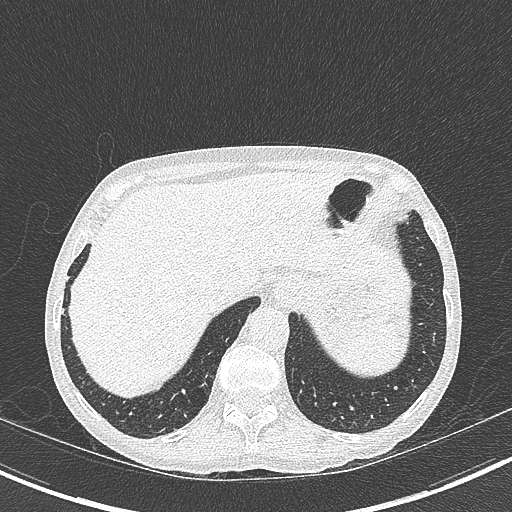
[im 65/285  lung]
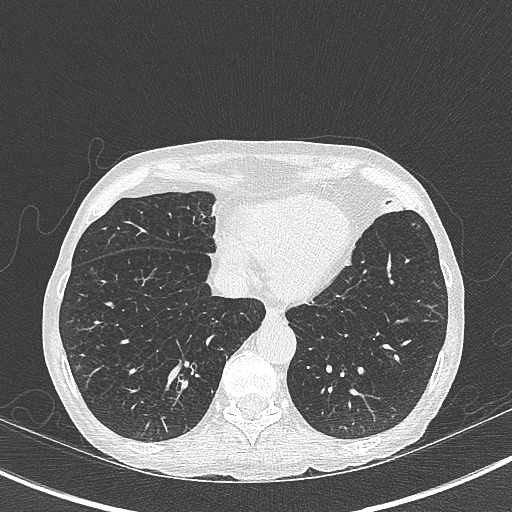
[im 91/285  lung]
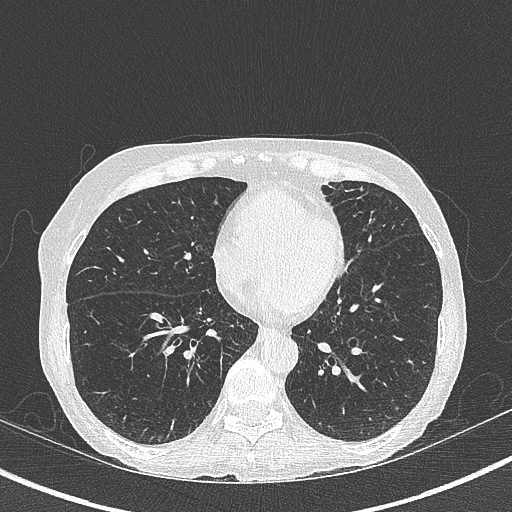
[im 104/285  mediastinal]
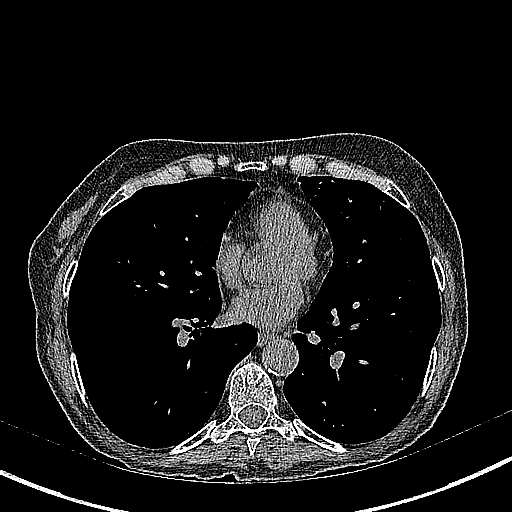
[im 104/285  lung]
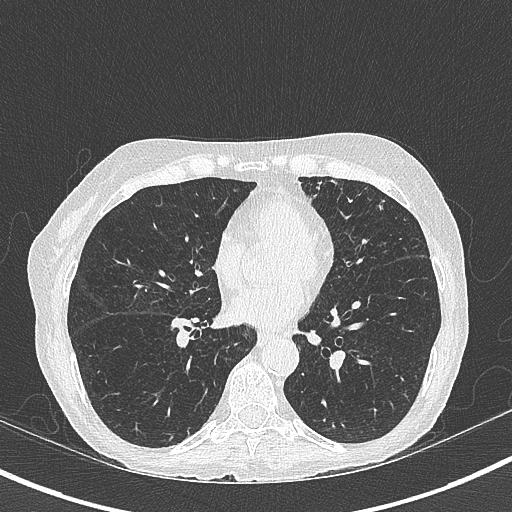
[im 130/285  lung]
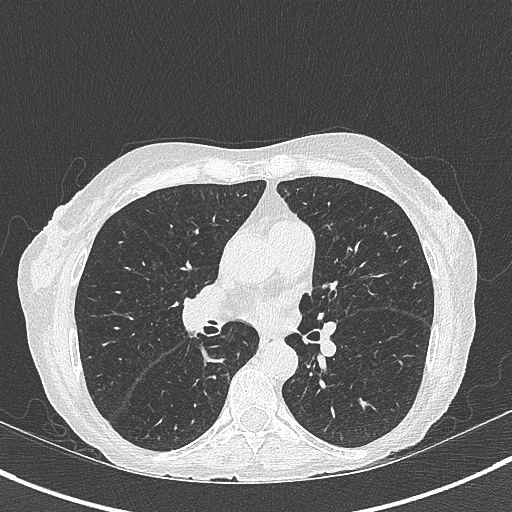
[im 155/285  lung]
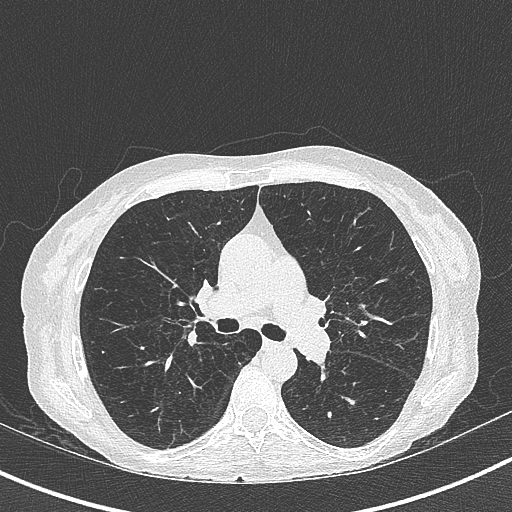
[im 181/285  lung]
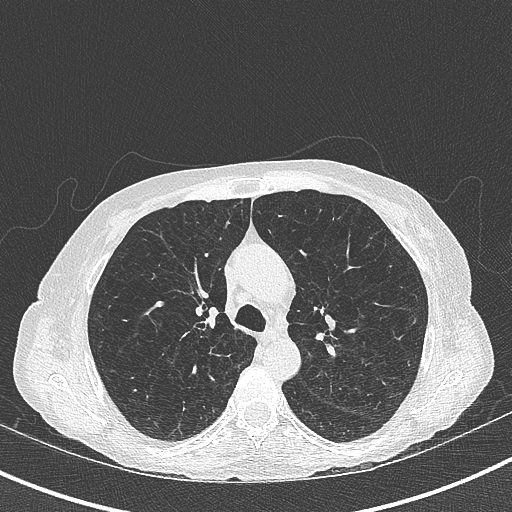
[im 194/285  mediastinal]
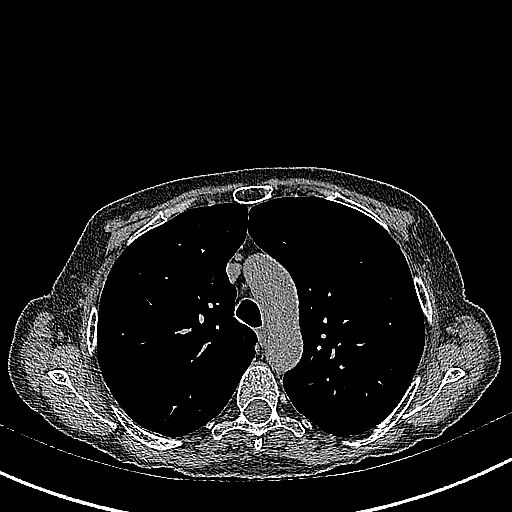
[im 194/285  lung]
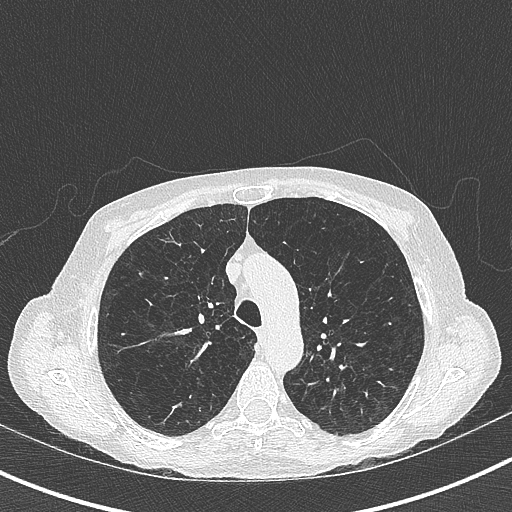
[im 220/285  lung]
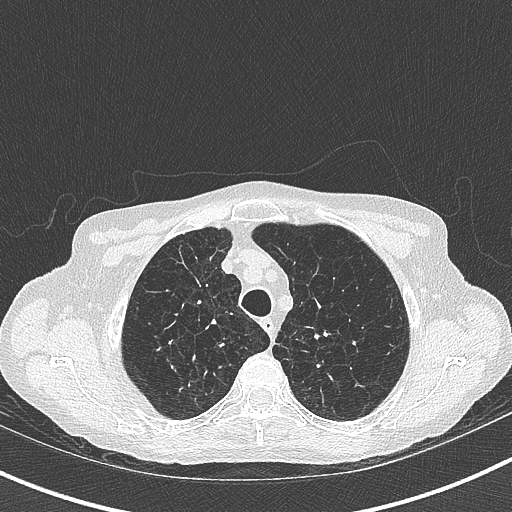
[im 246/285  lung]
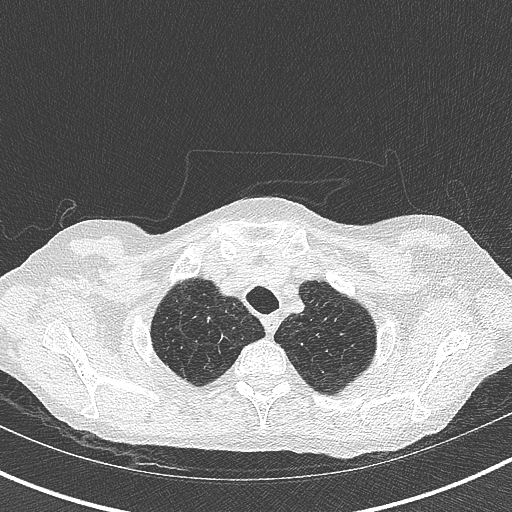
[im 272/285  lung]
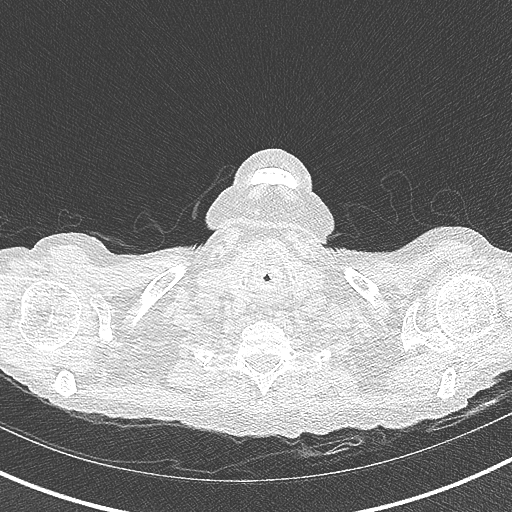

[Series 5: coronal · coronal · 0.49mm/px · 3 of 106 slices shown]
[im 22/106  lung]
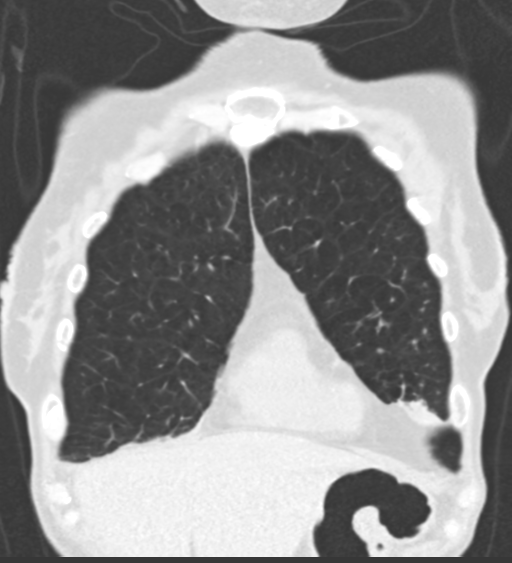
[im 43/106  lung]
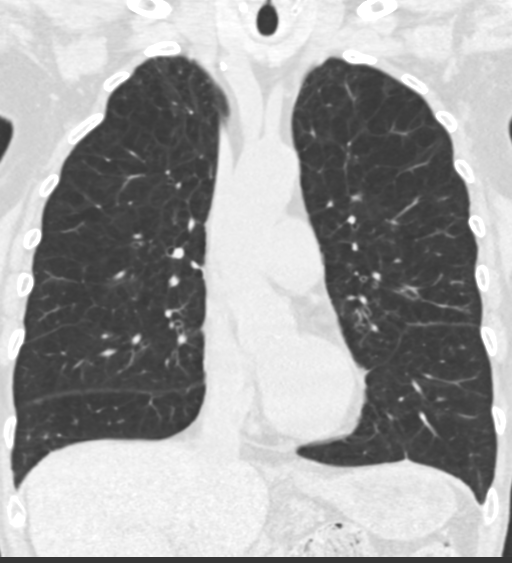
[im 64/106  lung]
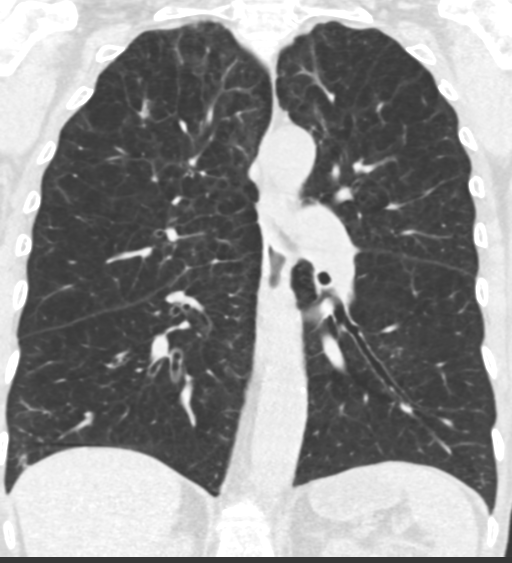

[15 of 36 positions shown; findings below may reference images not displayed]

FINDINGS: Cardiovascular: Atherosclerotic calcification of the aorta, aortic
valve and coronary arteries. Heart size normal. No pericardial
effusion.

Mediastinum/Nodes: No pathologically enlarged mediastinal or
axillary lymph nodes. Hilar regions are difficult to definitively
evaluate without IV contrast. Esophagus is grossly unremarkable.

Lungs/Pleura: Centrilobular emphysema. Scarring in the right middle
lobe, right lower lobe and lingula. 5.7 mm apical right upper lobe
nodule, unchanged. No suspicious pulmonary nodules. No pleural
fluid. Airway is unremarkable.

Upper Abdomen: Visualized portions of the liver, adrenal glands,
kidneys, spleen, pancreas, stomach and bowel are grossly
unremarkable.

Musculoskeletal: No worrisome lytic or sclerotic lesions.
IMPRESSION: 1. Lung-RADS 2, benign appearance or behavior. Continue annual
screening with low-dose chest CT without contrast in 12 months.
2. Aortic atherosclerosis (O7DHK-JHZ.[id number]). Coronary artery
calcification.
3.  Emphysema ([id number]).

## 2022-12-11 ENCOUNTER — Ambulatory Visit (INDEPENDENT_AMBULATORY_CARE_PROVIDER_SITE_OTHER): Payer: Medicare HMO | Admitting: Internal Medicine

## 2022-12-11 ENCOUNTER — Encounter: Payer: Self-pay | Admitting: Internal Medicine

## 2022-12-11 VITALS — BP 112/60 | HR 91 | Temp 98.2°F | Ht 59.5 in | Wt 86.8 lb

## 2022-12-11 DIAGNOSIS — Z122 Encounter for screening for malignant neoplasm of respiratory organs: Secondary | ICD-10-CM | POA: Diagnosis not present

## 2022-12-11 DIAGNOSIS — Z2911 Encounter for prophylactic immunotherapy for respiratory syncytial virus (RSV): Secondary | ICD-10-CM

## 2022-12-11 DIAGNOSIS — Z87891 Personal history of nicotine dependence: Secondary | ICD-10-CM

## 2022-12-11 DIAGNOSIS — J449 Chronic obstructive pulmonary disease, unspecified: Secondary | ICD-10-CM | POA: Diagnosis not present

## 2022-12-11 NOTE — Patient Instructions (Addendum)
ICD-10-CM   1. 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 Former smoker  Z87.891     3. Screening for malignant neoplasm of respiratory organ  Z12.2     4. 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  -Return to see Dr. Chase Caller or nurse practitioner in 6 months

## 2022-12-11 NOTE — Care Plan (Signed)
Primary Care Physician  Catalina Gravel, [address] [place], [place] [address] Tel [phone] Fax [phone]

## 2022-12-11 NOTE — Progress Notes (Signed)
OV 01/17/2016  Chief Complaint  Patient presents with   Follow-up    Pt states her breathing is at baseline. Pt states she feels the symbicort makes her wheeze and then feels she needs to use her albuterol hfa.    Follow-up Gold stage IV COPD-not on home oxygen but on triple inhaler therapy: She had an exacerbation July 2016 around the time I started her on triple inhaler therapy. That was treated with prednisone. Then in January 2017 at the time of last visit had another exacerbation treated with anabiotic's and prednisone. She tells me that several weeks after that she saw primary care physician and had to get repeat antibiotics or prednisone of which she is unsure. She currently feels stable. COPD cat score is 26 and reflects a stable baseline COPD which is improved compared to a year ago. Nevertheless she tells me that Symbicort makes a wheeze  Smoking: Continues to smoke. Struggles to quit  New issue is leg cramps: Ever since she started inhalers she's having leg cramps. Self treated with over-the-counter potassium which has helped. When she does not take potassium leg cramps get worse. She wants me to check her chemistry levels.  Lung cancer screening: She is eligible for this but she will need a referral to the cancer screening program.       OV 07/24/2016  Chief Complaint  Patient presents with   Follow-up    Pt states her SOB is unchanged since last OV. Pt c/o prod cough with white mucus - at baseline. Pt denies CP/tightness and f/c/s.       Follow-up Gold stage IV COPD. Not on home oxygen but on triple  inhaler therapy. Since last visit she continues to smoke. She is unable to pulmonary rehabilitation because of Medicaidlack of coverage. Her triple inhaler therapy is now Incruse and symbicort. She felt Spiriva was causing of thrush. She does not want her to [facility] or [facility] because these were ineffective according to subjective opinion. Current triple inhaler therapy  works well for her. She will have a flu shot. There are no new issues.    OV 11/01/2016  Chief Complaint  Patient presents with   Acute Visit    Pt c/o increase in SOB an prod cough with clear mucus and body aches, fever. Pt states she was given doxy. Pt states she has not taken her temp but she has had chills.    Acute visit for Gold stage IV COPD patient was not on oxygen therapy but on triple inhaler therapy and has continued to smoke.  Approximately one half weeks ago she developed a head cold. She saw her primary care physician and was given one half days of amoxicillin but this did not help. Subsequently on 10/25/2016 because of unimproved symptoms of cough headache or wheezing and subjective feverishness she saw primary care physician again and was prescribed doxycycline. She does not recollect chest x-ray at any time. Of note she is up-to-date with her flu shot. She does not think she has a flu but she does have fatigue. Therefore she decided to come in acutely today and upon arrival was hypoxemic on room air requiring. As of oxygen. This is a new finding for her. She is able to talk sentences but does not desire admission to the hospital. This no edema or hemoptysis or syncope   OV 02/03/2018  Chief Complaint  Patient presents with   Follow-up    Pt states she has been doing good  since last visit. Pt states she did quit smoking cigs but is doing the vape. Denies any complaints of cough, sob, or CP.    Follow-up Gold stage IV COPD not on oxygen therapy but on triple inhaler therapy and heavy smoker  Last seen February 2018 which is 14 months ago.  Since then overall COPD stable without any exacerbation.  She continues with Incruse and Symbicort.  She feels Symbicort works better for her.  In the past she has tried Firefighter and this is not helped.  She had questions about Trelegy inhaler but after talking to her that it was Incruse and Breo combination she does not want to do it.  Moreover  she has not had recurrent exacerbations.  Her COPD CAT score is 24 and slightly better than her baseline from 2 years ago.  Last CT scan of the chest was in 2016 without any evidence of lung cancer.  In talking to her she smoked 1 pack a day since age 1 through age 38.  Recently she has converted to vape in an effort to quit smoking but then she says she is liking vape and is worried that she might get addicted to it.  Review of her vaccines show that she has had Prevnar but developed an allergic reaction requiring prednisone with redness and itching in her left upper extremity.  There are no other new issues  OV 12/14/2019  Subjective:  Patient ID: Sonia Bush, female , DOB: Sep 15, 1961 , age 64 y.o. , MRN: [id number] , ADDRESS: [place] [address]   12/14/2019 -   Chief Complaint  Patient presents with   Follow-up    Last seen 02/03/18. Pt states she has been doing okay since last visit and denies any complaints.    - Follow-up Gold stage IV COPD not on oxygen therapy but on triple inhaler therapy   - Quit smoking 2019 but vaping   HPI Sonia Bush 62 y.o. -    Gold stage IV COPD but not on oxygen: She continues on Symbicort and Incruse.  Not seen her in 2 years.  Overall she is stable.  COPD CAT score shows stability.  She has been social distancing and keeping herself as safe as possible  Smoking: She quit tobacco in 2019.  She switched to vaping in an effort to quit tobacco smoking.  However she has not been able to come off vaping.  She understands the need.  She says that she slowly is working her way into quitting vaping  New issue: Vaccine counseling.  She is keen on getting the COVID-19 vaccine.  She initially asked questions about how to get it.  She has had difficulty reaching vaccination website for Covid.  Then upon review of the chart I noticed that in 2019 she indicated to me she had allergic reaction in her left upper extremity after getting  Prevnar.  She did get prednisone and get better.  Otherwise she has had other vaccines without any problems.  She does not have any allergies otherwise.  She tells me that she is very keen on getting the COVID-19 vaccine.  She tells me that if Moderna vaccine is the only choice [slightly higher side effect of arm swelling with Moderna) then she will take it.  She prefers Production designer, theatre/television/film due to higher efficacy.  I have advised her that [place] would be #1 choice.  Followed by Toma Aran or Moderna based on her analysis of risk  benefit.  CAT Score 12/14/2019 12/10/2018 02/03/2018  Total CAT Score '22 22 24     '$ CAT COPD Symptom & Quality of Life Score ([place]) 0 is no burden. 5 is highest burden 01/17/2016  02/03/2018   Never Cough -> Cough all the time 3 2  No phlegm in chest -> Chest is full of phlegm 3 3  No chest tightness -> Chest feels very tight 2 2  No dyspnea for 1 flight stairs/hill -> Very dyspneic for 1 flight of stairs 4 5  No limitations for ADL at home -> Very limited with ADL at home 4 4  Confident leaving home -> Not at all confident leaving home 3 3  Sleep soundly -> Do not sleep soundly because of lung condition 3 0  Lots of Energy -> No energy at all 4 5  TOTAL Score (max 40)  26 24    Simple office walk 185 feet x  3 laps goal with forehead probe 12/14/2019   O2 used ra  Number laps completed 3  Comments about pace slow  Resting Pulse Ox/HR 97% and 90/min  Final Pulse Ox/HR 92% and 113/min  Desaturated </= 88% no  Desaturated <= 3% points Yes, 5 poin  Got Tachycardic >/= 90/min yes  Symptoms at end of test Mild dyspnea  Miscellaneous comments x      IMPRESSION: 1. Lung-RADS 2, benign appearance or behavior. Continue annual screening with low-dose chest CT without contrast in 12 months. 2.  Emphysema (ICD10-J43.9). 3.  Aortic atherosclerosis (ICD10-170.0).     Electronically Signed   By: Lorin Picket M.D.   On: 07/01/2019 08:35    ROS - per  HPI   11/21/2020 Patient contacted today for acute visit. She originally called our office on 11/11/20 with reports of intermittent low grade fever and shortness of breath. She was prescribed course of Doxycyline and prednisone taper for suspected COPD exacerbations. She was advised to get covid tested. She took a home tested that came back negative, Dr. Chase Caller recommended PCR testing. She reports that once she completed abx and prednisone her temperature came back.   Normally her breathing is labored with moderate exertion. She is currently experiencing a lot of wheezing. She has a chronic dry cough. Denies purulent mucus. Associated body aches, chills, dry mouth. She has not checked her temperature today. Last night her temperature was 100.4. She is on chronic pain medication which does have tylenol in it. She is scheduled for PCR covid testing today at 4pm. Dermatologist has her on '50mg'$  doxycycline once a day for the last several months. She missed LDCT this past September 2021 d/t covid precautions. Denies GI or urinary symptoms.     Observations/Objective:  - Able to speak in full sentences; no overt shortness of breath, wheezing or cough   Assessment and Plan:  COPD exacerbation: - Dry cough with associated low grade fever, chills and wheezing. Concern for prolonged AECOPD vs CAP, no improvement with doxycycline course - Rx Levaquin '500mg'$  qd x 7 days and prednisone taper ('40mg'$  x 2 days, '20mg'$  x 2 days, '10mg'$  x 2 days) - Change Symbicort 80 to 168mg; continue Incruse Ellipta  - Needs PCR covid testing and checking CXR   Follow Up Instructions:  - After covid testing/CXR and due for regular follow-up with Dr. RChase Caller   I discussed the assessment and treatment plan with the patient. The patient was provided an opportunity to ask questions and all were answered. The patient agreed  with the plan and demonstrated an understanding of the instructions.   The patient was advised to call  back or seek an in-person evaluation if the symptoms worsen or if the condition fails to improve as anticipated.  I provided 22 minutes of non-face-to-face time during this encounter.   Martyn Ehrich, NP     11/25/2020 - Interim hx  Patient presents today for acute visit/ 4 day follow-up COPD exacerbations. She is on day #4 of Levaquin course. No difference on increased Symbicort dose. She is compliant with Incruse. Prednisone taper was extended by Dr. Chase Caller. [facility] that was negative. Her breathing has improved ever so slightly. She has a dry cough and wheezing. She was previously on oxygen but never got service. She has not had a fever since starting antibiotics. She is still vaping. She report that she has mold in a part of her house that is not used much. Denies GI symptoms, urinary symptoms. LDCT on 06/04/19 showed severe centrilobular emphysema. Mild biapical pleuroparenchymal scarring. Pulmonary nodules measure 5.9 mm or less in size.  CXR showed hyperinflation and bronchitic changes  Office treatment: - Depo-medrol '60mg'$  IM x1  - Check POC   Recommendations: - Stop Symbicort and Incruse - Start [employer] tomorrow- take two puffs morning and evening (use aerochamber) - Use 2L oxygen continuously to keep O2 >88-90%   - Continue Levaquin for 2 more days (discuss further abx with MR on Monday) - Stay on '20mg'$  prednisone daily until follow-up   Orders: - Needs labs either this weekend or Monday (D-dimer, BNP, CBC with diff, BMET) - Please provide patient with Aerochamber if she does not have one   Follow-up: - Monday televisit with MR   OV 11/28/2020  Subjective:  Patient ID: Sonia Bush, female , DOB: [birth year] , age 34 y.o. , MRN: [id number] , ADDRESS: [place] [address] PCP Tamsen Roers, MD Patient Care Team: Tamsen Roers, MD as PCP - General (Family Medicine)  This Provider for this visit: Treatment Team:   Attending Provider: Brand Males, MD  Type of visit: Telephone/Video Circumstance: COVID-19 national emergency Identification of patient Sonia Bush with 01/03/1961 and MRN [id number] - 2 person identifier Risks: Risks, benefits, limitations of telephone visit explained. Patient understood and verbalized agreement to proceed Anyone else on call: none Patient location: at home This provider location: [place] [phone]   11/28/2020 - AECOPD followup   HPI Sonia Bush 62 y.o. - finished steroid yestetday. Last day abx today.  Still some better only. Still unable to take shower. Feels better than few weeks ago. Says has o2 tank (thought small is heavy) sinc 11/25/20. Says is tripping her o2 tubing due to home set up of "intermittent steps". She lives in a trailer home. Every room has steps and even getting out.  Lives alone. Fell yesterday Bruised left knee "prettr bad: and foot is sore. Says [employer] dme company has smaller machines and wants prescriptions  Says she is fine with 2L [place]. Says room air she is fine but when she gets up and exerts  On BREZTRI now - she likes it  Has completed prednisone - 11/21/20 through 11/28/20 -> but feels she needs more  She hs blood work scheduled today but is reluctant to come due to fact she is worried about she wil trip on o2 but she is aware that without blood work data risk is on her. Advised her  she can do blood work at office and that data will be helpful for [place]. If getting worse to go to ER   CAT Score 12/14/2019 12/10/2018 02/03/2018  Total CAT Score '22 22 24         '$ Result Date: 11/25/2020 CLINICAL DATA:  Cough and low-grade fever.  History of COPD. EXAM: CHEST - 2 VIEW COMPARISON:  12/06/2016 FINDINGS: Lungs are hyperinflated. There is perihilar peribronchial thickening. There are no focal consolidations or pleural effusions. No pulmonary edema. IMPRESSION: Hyperinflation and bronchitic changes. Electronically Signed   By:  Nolon Nations M.D.   On: 11/25/2020 16:38       OV 12/11/2022  Subjective:  Patient ID: Sonia Bush, female , DOB: [birth year] , age 18 y.o. , MRN: [id number] , ADDRESS: [address] [place] [address] PCP [facility] Patient Care Team: [facility] as PCP - General Ali Lowe, Arlie Solomons, MD as PCP - Cardiology (Cardiology)  This Provider for this visit: Treatment Team:  Attending Provider: Brand Males, MD    12/11/2022 -   Chief Complaint  Patient presents with   Follow-up    Doing well.  Only uses oxygen with exertion or if has respiratory flare.     HPI Sonia Bush 62 y.o. - I personally not seen her in 2 years.  She says she is doing well.  There has been no flareup.  She continues on Incruse and Symbicort.  This works well for her.  She is now diabetic.  She is on Actos.  She also had epistaxis recently and therefore is using only oxygen as needed.  However there is no admissions for COPD exacerbations or pneumonia.  Last CT scan of the chest was in May 2023 and benign behavior was noted with a follow-up recommended in May 2024.  She has upcoming cardiology visit in July 2024 and she wants to get the scan sometime in June 2024.  We discussed RSV vaccine.  She gets transient side effect with erythema locally to flu shot and pneumonia vaccine.  We discussed the fact RSV vaccine very similar to flu shot and she should expect the same.  [facility] because the benefit outweighs the risk.   She mentioned dry skin.  She is also mentioning hair loss.  Advised her to try flaxseed for dry skin.   PFT     Latest Ref Rng & Units 09/13/2021    2:06 PM 03/31/2015   11:16 AM  PFT Results  FVC-Pre L 1.74  1.33   FVC-Predicted Pre % 63  45   FVC-Post L 1.64  1.97   FVC-Predicted Post % 59  68   Pre FEV1/FVC % % 47  39   Post FEV1/FCV % % 45  34   FEV1-Pre L 0.81  0.51   FEV1-Predicted Pre % 38  22    FEV1-Post L 0.73  0.66   DLCO uncorrected ml/min/mmHg 6.80  4.57   DLCO UNC% % 39  25   DLCO corrected ml/min/mmHg 6.80    DLCO COR %Predicted % 39    DLVA Predicted % 41  42    CT MAy 2023  Narrative & Impression  CLINICAL DATA:  Former smoker, quit 2019, 45 pack-year history.   EXAM: CT CHEST WITHOUT CONTRAST LOW-DOSE FOR LUNG CANCER SCREENING   TECHNIQUE: Multidetector CT imaging of the chest was performed following the standard protocol without IV contrast.   RADIATION  DOSE REDUCTION: This exam was performed according to the departmental dose-optimization program which includes automated exposure control, adjustment of the mA and/or kV according to patient size and/or use of iterative reconstruction technique.   COMPARISON:  01/25/2021.   FINDINGS: Cardiovascular: Atherosclerotic calcification of the aorta, aortic valve and coronary arteries. Heart size normal. No pericardial effusion.   Mediastinum/Nodes: No pathologically enlarged mediastinal or axillary lymph nodes. Hilar regions are difficult to definitively evaluate without IV contrast. Esophagus is grossly unremarkable.   Lungs/Pleura: Centrilobular emphysema. Scarring in the right middle lobe, right lower lobe and lingula. 5.7 mm apical right upper lobe nodule, unchanged. No suspicious pulmonary nodules. No pleural fluid. Airway is unremarkable.   Upper Abdomen: Visualized portions of the liver, adrenal glands, kidneys, spleen, pancreas, stomach and bowel are grossly unremarkable.   Musculoskeletal: No worrisome lytic or sclerotic lesions.   IMPRESSION: 1. Lung-RADS 2, benign appearance or behavior. Continue annual screening with low-dose chest CT without contrast in 12 months. 2. Aortic atherosclerosis (ICD10-I70.0). Coronary artery calcification. 3.  Emphysema (ICD10-J43.9).     Electronically Signed   By: Lorin Picket M.D.   On: 02/14/2022 15:02      has a past medical history of Anxiety,  Asthmatic bronchitis, Chronic pain, COPD (chronic obstructive pulmonary disease) ([place]), Diabetes mellitus without complication ([place]), Hypertension, and Racing heart beat.   reports that she quit smoking about 5 years ago. Her smoking use included cigarettes. She started smoking about 51 years ago. She has a 69.00 pack-year smoking history. She has never used smokeless tobacco.  Past Surgical History:  Procedure Laterality Date   ECTOPIC PREGNANCY SURGERY     TUBAL LIGATION      Allergies  Allergen Reactions   Breztri Aerosphere [Budeson-Glycopyrrol-Formoterol] Swelling   Prevnar 13 [Pneumococcal 13-Val Conj Vacc]     Arm swelling    Immunization History  Administered Date(s) Administered   Fluad Quad(high Dose 65+) 07/10/2020   Influenza Split 08/01/2014, 07/27/2015   Influenza,inj,Quad PF,6+ Mos 07/24/2016, 08/01/2017, 07/01/2018, 07/02/2019, 08/01/2021   PFIZER(Purple Top)SARS-COV-2 Vaccination 12/17/2019, 01/11/2020, 09/07/2020   Pneumococcal Conjugate-13 04/08/2015    Family History  Problem Relation Age of Onset   Asthma Daughter    Asthma Daughter    Skin cancer Father    Alzheimer's disease Father      Current Outpatient Medications:    albuterol (VENTOLIN HFA) 108 (90 Base) MCG/ACT inhaler, INHALE 2 PUFFS EVERY 6HRS AS NEEDED FOR WHEEZING/SHORTNESS OF BREATH (Patient taking differently: Inhale 2 puffs into the lungs every 6 (six) hours as needed for wheezing or shortness of breath. INHALE 2 PUFFS EVERY 6HRS AS NEEDED FOR WHEEZING/SHORTNESS OF BREATH), Disp: 8.5 each, Rfl: 10   aspirin EC 81 MG tablet, Take 1 tablet (81 mg total) by mouth daily. Swallow whole. (Patient taking differently: Take 81 mg by mouth daily as needed (swelling). Swallow whole.), Disp: 90 tablet, Rfl: 3   Blood Glucose Monitoring Suppl (CONTOUR NEXT EZ) w/Device KIT, by Does not apply route., Disp: , Rfl:    Blood Pressure KIT, 1 kit by Does not apply route daily., Disp: 1 kit, Rfl: 0    budesonide-formoterol (SYMBICORT) 160-4.5 MCG/ACT inhaler, INHALE 2 PUFFS BY MOUTH INTO THE LUNGS IN THE MORNING AND AT BEDTIME., Disp: 10.2 each, Rfl: 11   clonazePAM (KLONOPIN) 1 MG tablet, Take 1 tablet (1 mg total) by mouth 2 (two) times daily. (Patient taking differently: Take 0.125-0.5 mg by mouth daily as needed for anxiety.), Disp: 10 tablet, Rfl: 0   cloNIDine (  CATAPRES) 0.2 MG tablet, Take 0.2 mg by mouth at bedtime. , Disp: , Rfl:    HYDROcodone-acetaminophen (NORCO) 10-325 MG tablet, Take 0.5-1 tablets by mouth every 6 (six) hours as needed for moderate pain., Disp: , Rfl:    INCRUSE ELLIPTA 62.5 MCG/ACT AEPB, INHALE 1 PUFF BY MOUTH EVERY DAY (Patient taking differently: Inhale 1 puff into the lungs daily. INHALE 1 PUFF BY MOUTH EVERY DAY), Disp: 90 each, Rfl: 1   MAGNESIUM PO, Take 200 mg by mouth every evening., Disp: , Rfl:    Multiple Vitamins-Minerals (MULTI FOR HER 50+ PO), Take 1 tablet by mouth daily., Disp: , Rfl:    pantoprazole (PROTONIX) 40 MG tablet, TAKE 1 TABLET BY MOUTH EVERY DAY, Disp: 90 tablet, Rfl: 0   pioglitazone (ACTOS) 30 MG tablet, Take 30 mg by mouth daily., Disp: , Rfl:    Potassium (POTASSIMIN PO), Take 99 mg by mouth every evening., Disp: , Rfl:    pravastatin (PRAVACHOL) 10 MG tablet, Take 10 mg by mouth every evening., Disp: , Rfl:  No current facility-administered medications for this visit.  Facility-Administered Medications Ordered in Other Visits:    albuterol (PROVENTIL) (2.5 MG/3ML) 0.083% nebulizer solution 2.5 mg, 2.5 mg, Nebulization, Once, Brand Males, MD      Objective:   Vitals:   12/11/22 1438  BP: 112/60  Pulse: 91  Temp: 98.2 F (36.8 C)  TempSrc: Oral  SpO2: 92%  Weight: 86 lb 12.8 oz (39.4 kg)  Height: 4' 11.5" (1.511 m)    Estimated body mass index is 17.24 kg/m as calculated from the following:   Height as of this encounter: 4' 11.5" (1.511 m).   Weight as of this encounter: 86 lb 12.8 oz (39.4  kg).  '@WEIGHTCHANGE'$ @  [employer]   12/11/22 1438  Weight: 86 lb 12.8 oz (39.4 kg)     Physical Exam    General: No distress. thin Neuro: Alert and Oriented x 3. GCS 15. Speech normal Psych: Pleasant Resp:  Barrel Chest - yes.  Wheeze - no, Crackles - no, No overt respiratory distress CVS: Normal heart sounds. Murmurs - no Ext: Stigmata of Connective Tissue Disease - no HEENT: Normal upper airway. PEERL +. No post nasal drip        Assessment:       ICD-10-CM   1. COPD, very severe ([place])  J44.9     2. Former smoker  Z87.891     3. Screening for malignant neoplasm of respiratory organ  Z12.2     4. Need for RSV vaccination  Z29.11          Plan:     Patient Instructions     ICD-10-CM   1. COPD, very severe ([place])  J44.9     2. Former smoker  Z87.891     3. Screening for malignant neoplasm of respiratory organ  Z12.2     4. Need for RSV vaccination  Z29.11      COPD, very severe ([place])  Stable disease without any flareup  Plan - Continue Symbicort and Incruse as scheduled with albuterol as needed  Former smoker Screening for malignant neoplasm of respiratory organ  -Do annual low-dose CT scan of the chest sometime in the second part of June 2024 before your cardiology visit in July 2024  Need for RSV vaccination  Plan  - [facility]  COPD itself appears stable although your are a frequent exacerbaor  Follow-up severe COPD.  -Return to see  Dr. Chase Caller or nurse practitioner in 32 months    SIGNATURE    Dr. Brand Males, M.D., F.C.C.P,  Pulmonary and Critical Care Medicine Staff Physician, [place] Director - Interstitial Lung Disease  Program  Pulmonary [place] at [place], [place], [address]  Pager: [phone], If no answer or between  15:00h - 7:00h: call [phone] Telephone: [phone]  3:05 PM 12/11/2022

## 2022-12-21 ENCOUNTER — Other Ambulatory Visit: Payer: Self-pay | Admitting: Internal Medicine

## 2022-12-21 DIAGNOSIS — J449 Chronic obstructive pulmonary disease, unspecified: Secondary | ICD-10-CM

## 2023-01-06 ENCOUNTER — Other Ambulatory Visit: Payer: Self-pay | Admitting: Internal Medicine

## 2023-02-15 ENCOUNTER — Ambulatory Visit (HOSPITAL_COMMUNITY): Payer: Medicare HMO

## 2023-02-15 ENCOUNTER — Ambulatory Visit (HOSPITAL_COMMUNITY)
Admission: RE | Admit: 2023-02-15 | Discharge: 2023-02-15 | Disposition: A | Discharge status: Home / Self Care | Payer: Medicare HMO | Source: Ambulatory Visit | Attending: Acute Care | Admitting: Acute Care

## 2023-02-15 DIAGNOSIS — Z87891 Personal history of nicotine dependence: Secondary | ICD-10-CM | POA: Diagnosis present

## 2023-02-15 DIAGNOSIS — Z122 Encounter for screening for malignant neoplasm of respiratory organs: Secondary | ICD-10-CM

## 2023-02-20 ENCOUNTER — Other Ambulatory Visit: Payer: Self-pay | Admitting: Acute Care

## 2023-02-20 DIAGNOSIS — Z87891 Personal history of nicotine dependence: Secondary | ICD-10-CM

## 2023-02-20 DIAGNOSIS — Z122 Encounter for screening for malignant neoplasm of respiratory organs: Secondary | ICD-10-CM

## 2023-03-23 ENCOUNTER — Emergency Department (HOSPITAL_COMMUNITY): Payer: Medicare HMO

## 2023-03-23 ENCOUNTER — Emergency Department (HOSPITAL_COMMUNITY)
Admission: EM | Admit: 2023-03-23 | Discharge: 2023-03-24 | Disposition: A | Discharge status: Home / Self Care | Payer: Medicare HMO | Attending: Emergency Medicine | Admitting: Emergency Medicine

## 2023-03-23 ENCOUNTER — Other Ambulatory Visit: Payer: Self-pay

## 2023-03-23 DIAGNOSIS — J441 Chronic obstructive pulmonary disease with (acute) exacerbation: Secondary | ICD-10-CM | POA: Diagnosis not present

## 2023-03-23 DIAGNOSIS — Z9981 Dependence on supplemental oxygen: Secondary | ICD-10-CM | POA: Diagnosis not present

## 2023-03-23 DIAGNOSIS — R0602 Shortness of breath: Secondary | ICD-10-CM | POA: Diagnosis present

## 2023-03-23 LAB — CBC WITH DIFFERENTIAL/PLATELET
Abs Immature Granulocytes: 0.05 10*3/uL (ref 0.00–0.07)
Basophils Absolute: 0 10*3/uL (ref 0.0–0.1)
Basophils Relative: 0 %
Eosinophils Absolute: 0 10*3/uL (ref 0.0–0.5)
Eosinophils Relative: 0 %
HCT: 49.2 % — ABNORMAL HIGH (ref 36.0–46.0)
Hemoglobin: 15.8 g/dL — ABNORMAL HIGH (ref 12.0–15.0)
Immature Granulocytes: 1 %
Lymphocytes Relative: 8 %
Lymphs Abs: 0.7 10*3/uL (ref 0.7–4.0)
MCH: 32 pg (ref 26.0–34.0)
MCHC: 32.1 g/dL (ref 30.0–36.0)
MCV: 99.6 fL (ref 80.0–100.0)
Monocytes Absolute: 0.6 10*3/uL (ref 0.1–1.0)
Monocytes Relative: 6 %
Neutro Abs: 7.4 10*3/uL (ref 1.7–7.7)
Neutrophils Relative %: 85 %
Platelets: 187 10*3/uL (ref 150–400)
RBC: 4.94 MIL/uL (ref 3.87–5.11)
RDW: 12.2 % (ref 11.5–15.5)
WBC: 8.7 10*3/uL (ref 4.0–10.5)
nRBC: 0 % (ref 0.0–0.2)

## 2023-03-23 LAB — TROPONIN I (HIGH SENSITIVITY): Troponin I (High Sensitivity): 12 ng/L (ref <18)

## 2023-03-23 MED ORDER — PREDNISONE 50 MG PO TABS: 50.0000 mg | ORAL_TABLET | Freq: Every day | ORAL | 0 refills | Status: AC

## 2023-03-23 MED ORDER — PREDNISONE 50 MG PO TABS: 50.0000 mg | ORAL_TABLET | Freq: Every day | ORAL | 0 refills | Status: DC

## 2023-03-23 MED ORDER — AZITHROMYCIN 250 MG PO TABS: 250.0000 mg | ORAL_TABLET | Freq: Once | ORAL | 0 refills | Status: AC

## 2023-03-23 MED ORDER — METHYLPREDNISOLONE SODIUM SUCC 125 MG IJ SOLR
125.0000 mg | Freq: Once | INTRAMUSCULAR | Status: AC
  Administered 2023-03-23: 125 mg via INTRAVENOUS
  Filled 2023-03-23: qty 2

## 2023-03-23 MED ORDER — AZITHROMYCIN 250 MG PO TABS
500.0000 mg | ORAL_TABLET | Freq: Once | ORAL | Status: AC
  Administered 2023-03-24: 500 mg via ORAL
  Filled 2023-03-23: qty 2

## 2023-03-23 MED ORDER — MAGNESIUM SULFATE 2 GM/50ML IV SOLN
2.0000 g | Freq: Once | INTRAVENOUS | Status: AC
  Administered 2023-03-23: 2 g via INTRAVENOUS
  Filled 2023-03-23: qty 50

## 2023-03-23 MED ORDER — AZITHROMYCIN 250 MG PO TABS: 250.0000 mg | ORAL_TABLET | Freq: Once | ORAL | 0 refills | Status: DC

## 2023-03-23 MED ORDER — IPRATROPIUM-ALBUTEROL 0.5-2.5 (3) MG/3ML IN SOLN
3.0000 mL | RESPIRATORY_TRACT | Status: AC | PRN
  Administered 2023-03-23 (×3): 3 mL via RESPIRATORY_TRACT
  Filled 2023-03-23: qty 3
  Filled 2023-03-23: qty 6

## 2023-03-23 MED ORDER — ALBUTEROL SULFATE HFA 108 (90 BASE) MCG/ACT IN AERS: 2.0000 | INHALATION_SPRAY | RESPIRATORY_TRACT | Status: DC | PRN

## 2023-03-23 NOTE — ED Provider Notes (Signed)
[facility]ERGENC[facility] DEPARTMENT AT [facility] Provider Note   CSN: [id number] Arrival date & time: 03/23/23  1643     History Chief Complaint  Patient presents with   Shortness of Breath   Nausea    HPI Sonia Bush is a 62 y.o. female presenting for chief complaint of shortness of breath.  She is a 62 year old female with a history of COPD on 2 L only when she is having a flare.  States she has a Patent examiner at home that she is been using for the last 3 days.  Brought in by her family because of worsening dyspnea on exertion and ability to even ambulate at her house. She denies nausea vomiting syncope shortness of breath or productive cough but is endorsing subjective fevers and chills as well as malaise.  No known sick contacts.  States that she has been so short of breath that she is struggling to participate in her inhalers.   Patient's recorded medical, surgical, social, medication list and allergies were reviewed in the Snapshot window as part of the initial history.   Review of Systems   Review of Systems  Constitutional:  Negative for chills and fever.  HENT:  Negative for ear pain and sore throat.   Eyes:  Negative for pain and visual disturbance.  Respiratory:  Positive for shortness of breath and wheezing. Negative for cough.   Cardiovascular:  Negative for chest pain and palpitations.  Gastrointestinal:  Negative for abdominal pain and vomiting.  Genitourinary:  Negative for dysuria and hematuria.  Musculoskeletal:  Negative for arthralgias and back pain.  Skin:  Negative for color change and rash.  Neurological:  Negative for seizures and syncope.  All other systems reviewed and are negative.   Physical Exam Updated Vital Signs BP 125/68   Pulse (!) 117   Temp 98.1 F (36.7 C) (Oral)   Resp 17   Ht 4\' 11"  (1.499 m)   Wt 39.4 kg   SpO2 96%   BMI 17.54 kg/m  Physical Exam Vitals and nursing note reviewed.  Constitutional:       General: She is not in acute distress.    Appearance: She is well-developed.  HENT:     Head: Normocephalic and atraumatic.  Eyes:     Conjunctiva/sclera: Conjunctivae normal.  Cardiovascular:     Rate and Rhythm: Normal rate and regular rhythm.     Heart sounds: No murmur heard. Pulmonary:     Effort: Pulmonary effort is normal. No respiratory distress.     Breath sounds: Decreased breath sounds and wheezing present.  Abdominal:     General: There is no distension.     Palpations: Abdomen is soft.     Tenderness: There is no abdominal tenderness. There is no right CVA tenderness or left CVA tenderness.  Musculoskeletal:        General: No swelling or tenderness. Normal range of motion.     Cervical back: Neck supple.  Skin:    General: Skin is warm and dry.  Neurological:     General: No focal deficit present.     Mental Status: She is alert and oriented to person, place, and time. Mental status is at baseline.     Cranial Nerves: No cranial nerve deficit.      ED Course/ Medical Decision Making/ A&P    Procedures Procedures   Medications Ordered in ED Medications  ipratropium-albuterol (DUONEB) 0.5-2.5 (3) MG/3ML nebulizer solution 3 mL (3 mLs Nebulization Given  03/23/23 2050)  azithromycin (ZITHROMAX) tablet 500 mg (has no administration in time range)  magnesium sulfate IVPB 2 g 50 mL (0 g Intravenous Stopped 03/23/23 2046)  methylPREDNISolone sodium succinate (SOLU-MEDROL) 125 mg/2 mL injection 125 mg (125 mg Intravenous Given 03/23/23 1931)   Medical Decision Making:   Sonia Bush is a 62 y.o. female with a history of COPD, who presented to the ED today with acute on chronic SOB. They are endorsing worsening of their baseline dyspnea over the past 48 hours. Their baseline is a 2L O2 requirement. \ On my initial exam, the pt was SOB and tachypneic. Audible wheezing and grossly decreased breath sounds appreciated.  They are endorsing increased sputum  production.    Reviewed and confirmed nursing documentation for past medical history, family history, social history.    Initial Assessment:   With the patient's presentation of SOB in the above setting, most likely diagnosis is COPD Exacerbation. Other diagnoses were considered including (but not limited to) CAP, PE, ACS, viral infection, PTX. These are considered less likely due to history of present illness and physical exam findings.   This is most consistent with an acute life/limb threatening illness complicated by underlying chronic conditions.  Initial Plan:  Empiric treatment of patient's symptoms with immediate initiation of inhaled bronchodilators and IV steroids. Given advanced nature of patient's presentation, will proceed with IV magnesium as a rescue therapy.   Evaluation for ACS with EKG and delta troponin  Evaluation for infectious versus intrathoracic abnormality with chest x-ray  Evaluation for volume overload with BNP  Screening labs including CBC and Metabolic panel to evaluate for infectious or metabolic etiology of disease.  Patient's Wells score is low and patient does not warrant further objective evaluation for PE based on consistency of presentation of alternative diagnosis.  Objective evaluation as below reviewed   Initial Study Results:   Laboratory  All laboratory results reviewed without evidence of clinically relevant pathology.     EKG EKG was reviewed independently. Rate, rhythm, axis, intervals all examined and without medically relevant abnormality. ST segments without concerns for elevations.    Radiology:  All images reviewed independently. Agree with radiology report at this time.   DG Chest 2 View  Result Date: 03/23/2023 CLINICAL DATA:  Shortness of breath. EXAM: CHEST - 2 VIEW COMPARISON:  November 25, 2020. FINDINGS: The heart size and mediastinal contours are within normal limits. Both lungs are clear. Hyperexpansion of the lungs is noted. The  visualized skeletal structures are unremarkable. IMPRESSION: No active cardiopulmonary disease.  Hyperexpansion of the lungs. Electronically Signed   By: Lupita Raider M.D.   On: 03/23/2023 17:37     Final Assessment and Plan:   After initiation of medical therapies, patient is grossly improved and no longer in acute distress.    Given the advanced nature of the patient's prenetation, patient will require advanced care and admission was recommended.  Patient states that she does not want to stay for any further care and management that she has been managing her COPD for years and feels comfortable managing it in the outpatient setting.  Reinforced concern for worsening COPD exacerbation including risk of pulmonary arrest and patient expressed understanding but states she will use her medications in outpatient setting.  She is requesting antibiotics as historically she has had significant improvement with her azithromycin in the outpatient setting.  Will give her single dose and recommend she follow-up with her PCP within 48 hours for serial reassessments.  Clinical Impression:  1. COPD exacerbation [facility])      Discharge    Clinical Impression:  1. COPD exacerbation [facility])      Discharge   Final Clinical Impression(s) / ED Diagnoses Final diagnoses:  COPD exacerbation (HCC)    Rx / DC Orders ED Discharge Orders          Ordered    azithromycin (ZITHROMAX) 250 MG tablet   Once        03/23/23 2222    predniSONE (DELTASONE) 50 MG tablet  Daily        03/23/23 2222              Glyn Ade, MD 03/23/23 2300

## 2023-03-23 NOTE — ED Triage Notes (Addendum)
Pt arrived via POV. C/o SOB and nausea for 3x days. Dyspnea at rest. Dry cough.  Pt tachy at 125-130 and with SPO2 of 86% in triage. Placed on 3L [place]  Hx COPD

## 2023-03-24 DIAGNOSIS — J441 Chronic obstructive pulmonary disease with (acute) exacerbation: Secondary | ICD-10-CM | POA: Diagnosis not present

## 2023-04-02 ENCOUNTER — Telehealth: Payer: Self-pay | Admitting: Internal Medicine

## 2023-04-02 NOTE — Telephone Encounter (Signed)
Patient has a flare up of her COPD and really would like a nurse to call her to discuss if she can get an antibiotic that would help.  She is not feeling well and cannot get out.  Please advise and call patient asap to discuss further today.  CB# [phone]

## 2023-04-03 ENCOUNTER — Ambulatory Visit: Payer: Medicare HMO | Attending: Physician Assistant | Admitting: Physician Assistant

## 2023-04-03 DIAGNOSIS — I2510 Atherosclerotic heart disease of native coronary artery without angina pectoris: Secondary | ICD-10-CM | POA: Insufficient documentation

## 2023-04-03 NOTE — Telephone Encounter (Signed)
Patient is calling again to speak with a nurse.  She would really like a call back as soon as possible to speak with her regarding her flare up of her COPD.  She would like some medication but would like the advice of the nurse whether she should be seen.  Please call patient to discuss at [phone]

## 2023-04-03 NOTE — Progress Notes (Deleted)
  Cardiology Office Note:    Date:  04/03/2023  ID:  Sonia Bush, DOB 10/04/1960, MRN [id number] PCP: [facility]  [facility] Providers Cardiologist:  Orbie Pyo, MD { Click to update primary MD,subspecialty MD or APP then REFRESH:1}    {Click to Open Review  :1}   Patient Profile:      Coronary artery Ca2+ (chest CT) PVCs  Monitor 11/2021: NSR, average heart rate 78; <1% PACs, <1% PVCs; no A-fib or ventricular arrhythmias Hypertension  TTE 02/14/2021: EF 60-65, no RWMA, GR 1 DD, normal RVSF, RAP 3 Diabetes mellitus  Peripheral arterial disease   ABIs 11/01/21: R 0.91, L 0.93 // LE arterial US 11/15/2021: R CIA mild to mod plaque, small caliber dist R peroneal w poss short segment occlusion; L CIA mild to mod plaque >> walking program; refer to PV Card if worse sx's Hyperlipidemia  Aortic atherosclerosis  Chronic Obstructive Pulmonary Disease        History of Present Illness:   Sonia Bush is a 62 y.o. female returns for follow up of coronary Ca2+, peripheral arterial disease. She was last seen in clinic in 03/2022 by Sharlene Dory, NP. ***  ROS ***    Studies Reviewed:        *** Risk Assessment/Calculations:   {Does this patient have ATRIAL FIBRILLATION?:[phone]} No BP recorded.  {Refresh Note OR Click here to enter BP  :1}***       Physical Exam:   VS:  There were no vitals taken for this visit.   Wt Readings from Last 3 Encounters:  03/23/23 86 lb 13.8 oz (39.4 kg)  12/11/22 86 lb 12.8 oz (39.4 kg)  11/11/22 86 lb (39 kg)    Physical Exam***    ASSESSMENT AND PLAN:   No problem-specific Assessment & Plan notes found for this encounter. ***    {Are you ordering a CV Procedure (e.g. stress test, cath, DCCV, TEE, etc)?   Press F2        :[id number]}  Dispo:  No follow-ups on file.  Signed, Tereso Newcomer, PA-C

## 2023-04-05 ENCOUNTER — Other Ambulatory Visit: Payer: Self-pay

## 2023-04-05 MED ORDER — LEVOFLOXACIN 500 MG PO TABS: 500.0000 mg | ORAL_TABLET | Freq: Every day | ORAL | 0 refills | Status: DC

## 2023-04-05 MED ORDER — PREDNISONE 10 MG PO TABS: ORAL_TABLET | ORAL | 0 refills | Status: DC

## 2023-04-05 NOTE — Telephone Encounter (Signed)
  Unfortunately the copd flare up is not esolving. And we can do antoehr antibiotic and another prednisone. Short inceases in blood sugar <300 for few days is ok  Plan  - Please take prednisone 40 mg x1 day, then 30 mg x1 day, then 20 mg x1 day, then 10 mg x1 day, and then 5 mg x1 day and stop - Take doxycycline 100mg  po twice daily x 5 days; take after meals and avoid sunlight    Latest Reference Range & Units 11/14/11 02:25 12/01/20 15:08 03/23/23 18:03  Eosinophils Absolute 0.0 - 0.5 K/uL 0.2 0.1 0.0   Allergies  Allergen Reactions   Breztri Aerosphere [Budeson-Glycopyrrol-Formoterol] Swelling   Prevnar 13 [Pneumococcal 13-Val Conj Vacc]     Arm swelling

## 2023-04-05 NOTE — Telephone Encounter (Signed)
Called and spoke with patient. She verbalized understanding. However, she wants to know if MR would be willing to prescribe Levaquin instead of the doxycycline. She stated that the doxy does not work for her and Levaquin does.   MR, can you please advise? Thanks!

## 2023-04-05 NOTE — Telephone Encounter (Signed)
Ok to do  take levaquin 500mg  once daily  X 5 days; caution tendoniitis

## 2023-04-05 NOTE — Telephone Encounter (Signed)
Spoke to patient.  She stated that she was seen at ED 06/22 for COPD. She was prescribed zpak and prednisone.  Sx slightly improved but are still present.  She reports of increased SOB with exertion, non prod cough, wheezing, temp of 99.5 last night, chills and sweats x2wk. Hx of PNA. Clear CXR 03/23/23. She is using incruse once daily, Symbicort BID and albuterol HFA 6-7x daily.  She does not have a nebulizer.  No recent covid test.  Please note that patient is diabetic. Prednisone increases blood sugar per patient.  She is requesting Levaquin.   MR, please advise. Thanks

## 2023-04-05 NOTE — Telephone Encounter (Signed)
Spoke to patient. Advised Levaquin and Prednisone taper have been sent to pharmacy. NFN ATT

## 2023-04-16 ENCOUNTER — Telehealth: Payer: Self-pay | Admitting: Internal Medicine

## 2023-04-16 NOTE — Telephone Encounter (Signed)
Patient states having symptoms of cough and headache. Pharmacy is [facility] [address] Patient phone number is [phone].

## 2023-04-17 MED ORDER — PREDNISONE 20 MG PO TABS: ORAL_TABLET | ORAL | 0 refills | Status: AC

## 2023-04-17 NOTE — Telephone Encounter (Signed)
I called and spoke with the pt and notified of recs per [facility]

## 2023-04-17 NOTE — Telephone Encounter (Signed)
Pt c/o aches, chills, dry cough "rattling", increased SOB, minimal wheezing over the past 3 wks  She denies any fever She has already had round of levaquin prescribed on 04/02/23 with no improvement  She is taking incruse, proair, symbicort  No appt's open soon  Please advise, thanks!  Allergies  Allergen Reactions   Breztri Aerosphere [Budeson-Glycopyrrol-Formoterol] Swelling   Prevnar 13 [Pneumococcal 13-Val Conj Vacc]     Arm swelling

## 2023-04-17 NOTE — Telephone Encounter (Signed)
Prednisone 40 mg for 5 days then 20 mg for 5 days then stop.  New prescription was sent.  Any worsening in the interim she needs to go to the emergency room for further evaluation.

## 2023-04-27 ENCOUNTER — Other Ambulatory Visit: Payer: Self-pay | Admitting: Internal Medicine

## 2023-05-01 ENCOUNTER — Encounter: Payer: Self-pay | Admitting: Emergency Medicine

## 2023-05-01 ENCOUNTER — Ambulatory Visit: Payer: Medicare HMO | Admitting: Emergency Medicine

## 2023-05-01 VITALS — BP 108/62 | HR 103 | Temp 99.0°F | Ht 59.0 in | Wt 77.0 lb

## 2023-05-01 DIAGNOSIS — J449 Chronic obstructive pulmonary disease, unspecified: Secondary | ICD-10-CM | POA: Diagnosis not present

## 2023-05-01 MED ORDER — AZITHROMYCIN 250 MG PO TABS: 250.0000 mg | ORAL_TABLET | Freq: Every day | ORAL | 1 refills | Status: DC

## 2023-05-01 NOTE — Assessment & Plan Note (Signed)
Difficult to discern based on her description as to whether her symptoms are acute or subacute.  She was treated for possible acute flare in late June, did benefit from prednisone when she was on it.  Question whether she benefited from the antibiotics as well.  I discussed with her that she might require either long-term azithromycin, long-term low-dose prednisone or both.  She is hesitant to start scheduled prednisone due to side effects.  She is willing to try starting azithromycin.  She has follow-up with Dr. Marchelle Gearing in 2 weeks and I have asked her to report any improvement to him.  Suspect ultimately she will require long-term prednisone but I will leave that to them to discuss.

## 2023-05-01 NOTE — Progress Notes (Signed)
Subjective:    Patient ID: Sonia Bush, female    DOB: [birth year], 62 y.o.   MRN: [id number]  HPI Acute office visit 05/01/2023:  62 year old woman followed by Dr. Marchelle Gearing in our office for Gold D COPD. Maintained on Incruse and Symbicort.  Using albuterol approximately 3x a day.  She has been dealing with flaring symptoms for about 4 weeks.  Was seen in the ED 03/23/2023 with low-grade fevers, dyspnea, mucus production.  She has been treated with initially azithromycin, then prednisone and levofloxacin. Today she reports that she is still experiencing body aches, subjective fevers. She was never tested for COVID. She was breathing better on prednisone taper, ended 5 days ago - now with some increased dyspnea. Using albuterol 3x a day, which is about her baseline.    Review of Systems As per HPI  Past Medical History:  Diagnosis Date   Anxiety    Asthmatic bronchitis    Chronic pain    COPD (chronic obstructive pulmonary disease) (HCC)    Diabetes mellitus without complication (HCC)    Hypertension    Racing heart beat      Family History  Problem Relation Age of Onset   Asthma Daughter    Asthma Daughter    Skin cancer Father    Alzheimer's disease Father      Social History   Socioeconomic History   Marital status: Married    Spouse name: Not on file   Number of children: Not on file   Years of education: Not on file   Highest education level: Not on file  Occupational History   Occupation: Un[employer]  Tobacco Use   Smoking status: Former    Current packs/day: 0.00    Average packs/day: 1.5 packs/day for 46.1 years (69.1 ttl pk-yrs)    Types: Cigarettes    Start date: 37    Quit date: 11/01/2017    Years since quitting: 5.4   Smokeless tobacco: Never   Tobacco comments:    Patient does Vape.  Has nicotine.  Vaping from 2019 to present.  Updated 12/11/22 amy marsh, cma  Vaping Use   Vaping status: Every Day   Substances: Nicotine  Substance and  Sexual Activity   Alcohol use: Not Currently    Alcohol/week: 14.0 standard drinks of alcohol    Types: 14 Cans of beer per week   Drug use: No   Sexual activity: Not Currently  Other Topics Concern   Not on file  Social History Narrative   Not on file   Social Determinants of Health   Financial Resource Strain: Not on file  Food Insecurity: No Food Insecurity (03/07/2022)   Received from [facility], [facility]   Hunger Vital Sign    Worried About Running Out of Food in the Last Year: Never true    Ran Out of Food in the Last Year: Never true  Transportation Needs: Not on file  Physical Activity: Not on file  Stress: Not on file  Social Connections: Unknown (02/13/2022)   Received from [facility], [facility]   Social Network    Social Network: Not on file  Intimate Partner Violence: Unknown (01/05/2022)   Received from [facility], [facility]   HITS    Physically Hurt: Not on file    Insult or Talk Down To: Not on file    Threaten Physical Harm: Not on file    Scream or Curse: Not on file     Allergies  Allergen  Reactions   Breztri Aerosphere [Budeson-Glycopyrrol-Formoterol] Swelling   Prevnar 13 [Pneumococcal 13-Val Conj Vacc]     Arm swelling     Outpatient Medications Prior to Visit  Medication Sig Dispense Refill   albuterol (VENTOLIN HFA) 108 (90 Base) MCG/ACT inhaler INHALE 2 PUFFS EVERY 6HRS AS NEEDED FOR WHEEZING/SHORTNESS OF BREATH (Patient taking differently: Inhale 2 puffs into the lungs every 6 (six) hours as needed for wheezing or shortness of breath. INHALE 2 PUFFS EVERY 6HRS AS NEEDED FOR WHEEZING/SHORTNESS OF BREATH) 8.5 each 10   aspirin EC 81 MG tablet Take 1 tablet (81 mg total) by mouth daily. Swallow whole. (Patient taking differently: Take 81 mg by mouth daily as needed (swelling). Swallow whole.) 90 tablet 3   Blood Glucose Monitoring Suppl (CONTOUR NEXT EZ) w/Device KIT by Does not apply route.     Blood Pressure KIT 1 kit by Does not  apply route daily. 1 kit 0   budesonide-formoterol (SYMBICORT) 160-4.5 MCG/ACT inhaler INHALE 2 PUFFS BY MOUTH INTO THE LUNGS IN THE MORNING AND AT BEDTIME. 10.2 each 11   clonazePAM (KLONOPIN) 1 MG tablet Take 1 tablet (1 mg total) by mouth 2 (two) times daily. 10 tablet 0   cloNIDine (CATAPRES) 0.2 MG tablet Take 0.2 mg by mouth at bedtime.      HYDROcodone-acetaminophen (NORCO) 10-325 MG tablet Take 0.5-1 tablets by mouth every 6 (six) hours as needed for moderate pain or severe pain.     INCRUSE ELLIPTA 62.5 MCG/ACT AEPB INHALE 1 PUFF BY MOUTH EVERY DAY 90 each 1   JANUVIA 100 MG tablet Take 100 mg by mouth daily.     MAGNESIUM PO Take 200 mg by mouth every other day.     Multiple Vitamins-Minerals (MULTI FOR HER 50+ PO) Take 1 tablet by mouth daily.     naloxone (NARCAN) nasal spray 4 mg/0.1 mL Place 1 spray into the nose as directed. Accidental Overdose     pantoprazole (PROTONIX) 40 MG tablet TAKE 1 TABLET BY MOUTH EVERY DAY 30 tablet 0   pioglitazone (ACTOS) 15 MG tablet Take 15 mg by mouth daily.     Potassium (POTASSIMIN PO) Take 99 mg by mouth every other day.     pravastatin (PRAVACHOL) 10 MG tablet Take 10 mg by mouth every evening.     levofloxacin (LEVAQUIN) 500 MG tablet Take 1 tablet (500 mg total) by mouth daily. 5 tablet 0   Facility-Administered Medications Prior to Visit  Medication Dose Route Frequency Provider Last Rate Last Admin   albuterol (PROVENTIL) (2.5 MG/3ML) 0.083% nebulizer solution 2.5 mg  2.5 mg Nebulization Once Kalman Shan, MD             Objective:   Physical Exam  Vitals:   05/01/23 1410  BP: 108/62  Pulse: (!) 103  Temp: 99 F (37.2 C)  TempSrc: Oral  SpO2: 96%  Weight: 77 lb (34.9 kg)  Height: 4\' 11"  (1.499 m)   Gen: Pleasant, very thin, in no distress,  normal affect  ENT: No lesions,  mouth clear,  oropharynx clear, no postnasal drip  Neck: No JVD, no stridor  Lungs: No use of accessory muscles, quite distant, no crackles or  wheezing on normal respiration, no wheeze on forced expiration  Cardiovascular: RRR, heart sounds normal, no murmur or gallops, no peripheral edema  Musculoskeletal: No deformities, no cyanosis or clubbing  Neuro: alert, awake, non focal  Skin: Warm, no lesions or rash      Assessment & Plan:  COPD, very severe Difficult to discern based on her description as to whether her symptoms are acute or subacute.  She was treated for possible acute flare in late June, did benefit from prednisone when she was on it.  Question whether she benefited from the antibiotics as well.  I discussed with her that she might require either long-term azithromycin, long-term low-dose prednisone or both.  She is hesitant to start scheduled prednisone due to side effects.  She is willing to try starting azithromycin.  She has follow-up with Dr. Marchelle Gearing in 2 weeks and I have asked her to report any improvement to him.  Suspect ultimately she will require long-term prednisone but I will leave that to them to discuss.    Levy Pupa, MD, PhD 05/01/2023, 2:43 PM [facility] [phone] or if no answer before 7:00PM call [phone] For any issues after 7:00PM please call eLink [phone]

## 2023-05-01 NOTE — Patient Instructions (Addendum)
Please start azithromycin 250 mg once daily.  We will stay on this medication to see how you benefit.  Discussed this with Dr. Marchelle Gearing at your next office visit.  He may decide to continue with long-term We will hold off on steroids at this time but again you might benefit from being on low-dose prednisone every day to manage symptoms.  Discussed this with Dr. Marchelle Gearing also Continue your inhaled medication as you have been taking it Follow Dr. Marchelle Gearing in 2 weeks as already planned.

## 2023-05-14 ENCOUNTER — Ambulatory Visit (INDEPENDENT_AMBULATORY_CARE_PROVIDER_SITE_OTHER): Payer: Medicare HMO

## 2023-05-14 ENCOUNTER — Ambulatory Visit: Payer: Medicare HMO | Admitting: Internal Medicine

## 2023-05-14 ENCOUNTER — Encounter: Payer: Self-pay | Admitting: Internal Medicine

## 2023-05-14 VITALS — BP 120/80 | HR 130 | Ht 59.0 in | Wt 76.4 lb

## 2023-05-14 DIAGNOSIS — R0689 Other abnormalities of breathing: Secondary | ICD-10-CM

## 2023-05-14 DIAGNOSIS — R0600 Dyspnea, unspecified: Secondary | ICD-10-CM

## 2023-05-14 DIAGNOSIS — R634 Abnormal weight loss: Secondary | ICD-10-CM | POA: Diagnosis not present

## 2023-05-14 DIAGNOSIS — J449 Chronic obstructive pulmonary disease, unspecified: Secondary | ICD-10-CM

## 2023-05-14 DIAGNOSIS — R5383 Other fatigue: Secondary | ICD-10-CM

## 2023-05-14 DIAGNOSIS — T50905A Adverse effect of unspecified drugs, medicaments and biological substances, initial encounter: Secondary | ICD-10-CM

## 2023-05-14 LAB — CBC WITH DIFFERENTIAL/PLATELET
Basophils Absolute: 0 10*3/uL (ref 0.0–0.1)
Basophils Relative: 0.2 % (ref 0.0–3.0)
Eosinophils Absolute: 0.5 10*3/uL (ref 0.0–0.7)
Eosinophils Relative: 4.9 % (ref 0.0–5.0)
HCT: 41.8 % (ref 36.0–46.0)
Hemoglobin: 13.7 g/dL (ref 12.0–15.0)
Lymphocytes Relative: 8.1 % — ABNORMAL LOW (ref 12.0–46.0)
Lymphs Abs: 0.8 10*3/uL (ref 0.7–4.0)
MCHC: 32.7 g/dL (ref 30.0–36.0)
MCV: 97.7 fl (ref 78.0–100.0)
Monocytes Absolute: 1 10*3/uL (ref 0.1–1.0)
Monocytes Relative: 11 % (ref 3.0–12.0)
Neutro Abs: 7.2 10*3/uL (ref 1.4–7.7)
Neutrophils Relative %: 75.8 % (ref 43.0–77.0)
Platelets: 533 10*3/uL — ABNORMAL HIGH (ref 150.0–400.0)
RBC: 4.28 Mil/uL (ref 3.87–5.11)
RDW: 13.8 % (ref 11.5–15.5)
WBC: 9.5 10*3/uL (ref 4.0–10.5)

## 2023-05-14 LAB — BASIC METABOLIC PANEL
BUN: 15 mg/dL (ref 6–23)
CO2: 27 mEq/L (ref 19–32)
Calcium: 10.1 mg/dL (ref 8.4–10.5)
Chloride: 90 mEq/L — ABNORMAL LOW (ref 96–112)
Creatinine, Ser: 0.6 mg/dL (ref 0.40–1.20)
GFR: 95.44 mL/min (ref >60.00)
Glucose, Bld: 247 mg/dL — ABNORMAL HIGH (ref 70–99)
Potassium: 3.9 mEq/L (ref 3.5–5.1)
Sodium: 126 mEq/L — ABNORMAL LOW (ref 135–145)

## 2023-05-14 LAB — HEPATIC FUNCTION PANEL
ALT: 14 U/L (ref 0–35)
AST: 18 U/L (ref 0–37)
Albumin: 3.8 g/dL (ref 3.5–5.2)
Alkaline Phosphatase: 89 U/L (ref 39–117)
Bilirubin, Direct: 0.1 mg/dL (ref 0.0–0.3)
Total Bilirubin: 0.3 mg/dL (ref 0.2–1.2)
Total Protein: 7.6 g/dL (ref 6.0–8.3)

## 2023-05-14 LAB — CK: Total CK: 26 U/L (ref 7–177)

## 2023-05-14 LAB — BRAIN NATRIURETIC PEPTIDE: Pro B Natriuretic peptide (BNP): 359 pg/mL — ABNORMAL HIGH (ref 0.0–100.0)

## 2023-05-14 LAB — SEDIMENTATION RATE: Sed Rate: 112 mm/hr — ABNORMAL HIGH (ref 0–30)

## 2023-05-14 LAB — EXTRA SPECIMEN

## 2023-05-14 LAB — D-DIMER, QUANTITATIVE: D-Dimer, Quant: 1.03 mcg/mL FEU — ABNORMAL HIGH (ref <0.50)

## 2023-05-14 MED ORDER — PREDNISONE 10 MG PO TABS: ORAL_TABLET | ORAL | 0 refills | Status: AC

## 2023-05-14 NOTE — Addendum Note (Signed)
Addended by: Hedda Slade on: 05/14/2023 01:44 PM   Modules accepted: Orders

## 2023-05-14 NOTE — Progress Notes (Signed)
OV 01/17/2016  Chief Complaint  Patient presents with   Follow-up    Pt states her breathing is at baseline. Pt states she feels the symbicort makes her wheeze and then feels she needs to use her albuterol hfa.    Follow-up Gold stage IV COPD-not on home oxygen but on triple inhaler therapy: She had an exacerbation July 2016 around the time I started her on triple inhaler therapy. That was treated with prednisone. Then in January 2017 at the time of last visit had another exacerbation treated with anabiotic's and prednisone. She tells me that several weeks after that she saw primary care physician and had to get repeat antibiotics or prednisone of which she is unsure. She currently feels stable. COPD cat score is 26 and reflects a stable baseline COPD which is improved compared to a year ago. Nevertheless she tells me that Symbicort makes a wheeze  Smoking: Continues to smoke. Struggles to quit  New issue is leg cramps: Ever since she started inhalers she's having leg cramps. Self treated with over-the-counter potassium which has helped. When she does not take potassium leg cramps get worse. She wants me to check her chemistry levels.  Lung cancer screening: She is eligible for this but she will need a referral to the cancer screening program.       OV 07/24/2016  Chief Complaint  Patient presents with   Follow-up    Pt states her SOB is unchanged since last OV. Pt c/o prod cough with white mucus - at baseline. Pt denies CP/tightness and f/c/s.       Follow-up Gold stage IV COPD. Not on home oxygen but on triple  inhaler therapy. Since last visit she continues to smoke. She is unable to pulmonary rehabilitation because of Medicaidlack of coverage. Her triple inhaler therapy is now Incruse and symbicort. She felt Spiriva was causing of thrush. She does not want her to [facility] or [facility] because these were ineffective according to subjective opinion. Current triple inhaler therapy  works well for her. She will have a flu shot. There are no new issues.    OV 11/01/2016  Chief Complaint  Patient presents with   Acute Visit    Pt c/o increase in SOB an prod cough with clear mucus and body aches, fever. Pt states she was given doxy. Pt states she has not taken her temp but she has had chills.    Acute visit for Gold stage IV COPD patient was not on oxygen therapy but on triple inhaler therapy and has continued to smoke.  Approximately one half weeks ago she developed a head cold. She saw her primary care physician and was given one half days of amoxicillin but this did not help. Subsequently on 10/25/2016 because of unimproved symptoms of cough headache or wheezing and subjective feverishness she saw primary care physician again and was prescribed doxycycline. She does not recollect chest x-ray at any time. Of note she is up-to-date with her flu shot. She does not think she has a flu but she does have fatigue. Therefore she decided to come in acutely today and upon arrival was hypoxemic on room air requiring. As of oxygen. This is a new finding for her. She is able to talk sentences but does not desire admission to the hospital. This no edema or hemoptysis or syncope   OV 02/03/2018  Chief Complaint  Patient presents with   Follow-up    Pt states she has been doing  good since last visit. Pt states she did quit smoking cigs but is doing the vape. Denies any complaints of cough, sob, or CP.    Follow-up Gold stage IV COPD not on oxygen therapy but on triple inhaler therapy and heavy smoker  Last seen February 2018 which is 14 months ago.  Since then overall COPD stable without any exacerbation.  She continues with Incruse and Symbicort.  She feels Symbicort works better for her.  In the past she has tried Engineer, materials and this is not helped.  She had questions about Trelegy inhaler but after talking to her that it was Incruse and Breo combination she does not want to do it.  Moreover  she has not had recurrent exacerbations.  Her COPD CAT score is 24 and slightly better than her baseline from 2 years ago.  Last CT scan of the chest was in 2016 without any evidence of lung cancer.  In talking to her she smoked 1 pack a day since age 83 through age 64.  Recently she has converted to vape in an effort to quit smoking but then she says she is liking vape and is worried that she might get addicted to it.  Review of her vaccines show that she has had Prevnar but developed an allergic reaction requiring prednisone with redness and itching in her left upper extremity.  There are no other new issues  OV 12/14/2019  Subjective:  Patient ID: Sonia Bush, female , DOB: 03-09-1961 , age 78 y.o. , MRN: [id number] , ADDRESS: [address] [place] [address]   12/14/2019 -   Chief Complaint  Patient presents with   Follow-up    Last seen 02/03/18. Pt states she has been doing okay since last visit and denies any complaints.    - Follow-up Gold stage IV COPD not on oxygen therapy but on triple inhaler therapy   - Quit smoking 2019 but vaping   HPI Sonia Bush 62 y.o. -    Gold stage IV COPD but not on oxygen: She continues on Symbicort and Incruse.  Not seen her in 2 years.  Overall she is stable.  COPD CAT score shows stability.  She has been social distancing and keeping herself as safe as possible  Smoking: She quit tobacco in 2019.  She switched to vaping in an effort to quit tobacco smoking.  However she has not been able to come off vaping.  She understands the need.  She says that she slowly is working her way into quitting vaping  New issue: Vaccine counseling.  She is keen on getting the COVID-19 vaccine.  She initially asked questions about how to get it.  She has had difficulty reaching vaccination website for Covid.  Then upon review of the chart I noticed that in 2019 she indicated to me she had allergic reaction in her left upper extremity after getting  Prevnar.  She did get prednisone and get better.  Otherwise she has had other vaccines without any problems.  She does not have any allergies otherwise.  She tells me that she is very keen on getting the COVID-19 vaccine.  She tells me that if Moderna vaccine is the only choice [slightly higher side effect of arm swelling with Moderna) then she will take it.  She prefers Teacher, adult education due to higher efficacy.  I have advised her that Pfizer would be #1 choice.  Followed by Oletta Darter or Moderna based on her analysis of  risk benefit.  CAT Score 12/14/2019 12/10/2018 02/03/2018  Total CAT Score 22 22 24        IMPRESSION: 1. Lung-RADS 2, benign appearance or behavior. Continue annual screening with low-dose chest CT without contrast in 12 months. 2.  Emphysema (ICD10-J43.9). 3.  Aortic atherosclerosis (ICD10-170.0).     Electronically Signed   By: Leanna Battles M.D.   On: 07/01/2019 08:35    ROS - per HPI   11/21/2020 Patient contacted today for acute visit. She originally called our office on 11/11/20 with reports of intermittent low grade fever and shortness of breath. She was prescribed course of Doxycyline and prednisone taper for suspected COPD exacerbations. She was advised to get covid tested. She took a home tested that came back negative, Dr. Marchelle Gearing recommended PCR testing. She reports that once she completed abx and prednisone her temperature came back.   Normally her breathing is labored with moderate exertion. She is currently experiencing a lot of wheezing. She has a chronic dry cough. Denies purulent mucus. Associated body aches, chills, dry mouth. She has not checked her temperature today. Last night her temperature was 100.4. She is on chronic pain medication which does have tylenol in it. She is scheduled for PCR covid testing today at 4pm. Dermatologist has her on 50mg  doxycycline once a day for the last several months. She missed LDCT this past September 2021 d/t  covid precautions. Denies GI or urinary symptoms.     Observations/Objective:  - Able to speak in full sentences; no overt shortness of breath, wheezing or cough   Assessment and Plan:  COPD exacerbation: - Dry cough with associated low grade fever, chills and wheezing. Concern for prolonged AECOPD vs CAP, no improvement with doxycycline course - Rx Levaquin 500mg  qd x 7 days and prednisone taper (40mg  x 2 days, 20mg  x 2 days, 10mg  x 2 days) - Change Symbicort 80 to ; continue Incruse Ellipta  - Needs PCR covid testing and checking CXR   Follow Up Instructions:  - After covid testing/CXR and due for regular follow-up with Dr. Marchelle Gearing    I discussed the assessment and treatment plan with the patient. The patient was provided an opportunity to ask questions and all were answered. The patient agreed with the plan and demonstrated an understanding of the instructions.   The patient was advised to call back or seek an in-person evaluation if the symptoms worsen or if the condition fails to improve as anticipated.  I provided 22 minutes of non-face-to-face time during this encounter.   Glenford Bayley, NP     11/25/2020 - Interim hx  Patient presents today for acute visit/ 4 day follow-up COPD exacerbations. She is on day #4 of Levaquin course. No difference on increased Symbicort dose. She is compliant with Incruse. Prednisone taper was extended by Dr. Marchelle Gearing. She [facility]20 that was negative. Her breathing has improved ever so slightly. She has a dry cough and wheezing. She was previously on oxygen but never got service. She has not had a fever since starting antibiotics. She is still vaping. She report that she has mold in a part of her house that is not used much. Denies GI symptoms, urinary symptoms. LDCT on 06/04/19 showed severe centrilobular emphysema. Mild biapical pleuroparenchymal scarring. Pulmonary nodules measure 5.9 mm or less in  size.  CXR showed hyperinflation and bronchitic changes  Office treatment: - Depo-medrol 60mg  IM x1  - Check POC   Recommendations: -  Stop Symbicort and Incruse - Start [employer] tomorrow- take two puffs morning and evening (use aerochamber) - Use 2L oxygen continuously to keep O2 >88-90%   - Continue Levaquin for 2 more days (discuss further abx with MR on Monday) - Stay on 20mg  prednisone daily until follow-up   Orders: - Needs labs either this weekend or Monday (D-dimer, BNP, CBC with diff, BMET) - Please provide patient with Aerochamber if she does not have one   Follow-up: - Monday televisit with MR   OV 11/28/2020  Subjective:  Patient ID: Sonia Bush, female , DOB: 1961-09-21 , age 19 y.o. , MRN: [id number] , ADDRESS: [address] [place] [address] PCP Aida Puffer, MD Patient Care Team: Aida Puffer, MD as PCP - General (Family Medicine)  This Provider for this visit: Treatment Team:  Attending Provider: Kalman Shan, MD  Type of visit: Telephone/Video Circumstance: COVID-19 national emergency Identification of patient Sonia Bush with [birth year] and MRN [id number] - 2 person identifier Risks: Risks, benefits, limitations of telephone visit explained. Patient understood and verbalized agreement to proceed Anyone else on call: none Patient location: at home This provider location: [address] [place] [phone]   11/28/2020 - AECOPD followup   HPI Sonia Bush 62 y.o. - finished steroid yestetday. Last day abx today.  Still some better only. Still unable to take shower. Feels better than few weeks ago. Says has o2 tank (thought small is heavy) sinc 11/25/20. Says is tripping her o2 tubing due to home set up of "intermittent steps". She lives in a trailer home. Every room has steps and even getting out.  Lives alone. Fell yesterday Bruised left knee "prettr bad: and foot is sore. Says [employer] company has smaller machines and wants  prescriptions  Says she is fine with 2L [place]. Says room air she is fine but when she gets up and exerts  On BREZTRI now - she likes it  Has completed prednisone - 11/21/20 through 11/28/20 -> but feels she needs more  She hs blood work scheduled today but is reluctant to come due to fact she is worried about she wil trip on o2 but she is aware that without blood work data risk is on her. Advised her she can do blood work at office and that data will be helpful for [place]. If getting worse to go to ER   CAT Score 12/14/2019 12/10/2018 02/03/2018  Total CAT Score 22 22 24          Result Date: 11/25/2020 CLINICAL DATA:  Cough and low-grade fever.  History of COPD. EXAM: CHEST - 2 VIEW COMPARISON:  12/06/2016 FINDINGS: Lungs are hyperinflated. There is perihilar peribronchial thickening. There are no focal consolidations or pleural effusions. No pulmonary edema. IMPRESSION: Hyperinflation and bronchitic changes. Electronically Signed   By: Norva Pavlov M.D.   On: 11/25/2020 16:38       OV 12/11/2022  Subjective:  Patient ID: Sonia Bush, female , DOB: [birth year] , age 62 y.o. , MRN: [id number] , ADDRESS: [address] [place] [address] PCP [facility], [facility] Patient Care Team: [facility], [facility] as PCP - General Lynnette Caffey, Charlies Constable, MD as PCP - Cardiology (Cardiology)  This Provider for this visit: Treatment Team:  Attending Provider: Kalman Shan, MD    12/11/2022 -   Chief Complaint  Patient presents with   Follow-up    Doing well.  Only uses oxygen with exertion or if has respiratory flare.     HPI  Sonia Bush 62 y.o. - I personally not seen her in 2 years.  She says she is doing well.  There has been no flareup.  She continues on Incruse and Symbicort.  This works well for her.  She is now diabetic.  She is on Actos.  She also had epistaxis recently and therefore is using only oxygen as needed.  However there is no admissions for COPD  exacerbations or pneumonia.  Last CT scan of the chest was in May 2023 and benign behavior was noted with a follow-up recommended in May 2024.  She has upcoming cardiology visit in July 2024 and she wants to get the scan sometime in June 2024.  We discussed RSV vaccine.  She gets transient side effect with erythema locally to flu shot and pneumonia vaccine.  We discussed the fact RSV vaccine very similar to flu shot and she should expect the same.  [facility] vaccine because the benefit outweighs the risk.   Acute office visit 05/01/2023:  62 year old woman followed by Dr. Marchelle Gearing in our office for Gold D COPD. Maintained on Incruse and Symbicort.  Using albuterol approximately 3x a day.  She has been dealing with flaring symptoms for about 4 weeks.  Was seen in the ED 03/23/2023 with low-grade fevers, dyspnea, mucus production.  She has been treated with initially azithromycin, then prednisone and levofloxacin. Today she reports that she is still experiencing body aches, subjective fevers. She was never tested for COVID. She was breathing better on prednisone taper, ended 5 days ago - now with some increased dyspnea. Using albuterol 3x a day, which is about her baseline.      OV 05/14/2023  Subjective:  Patient ID: Sonia Bush, female , DOB: 10/01/1961 , age 19 y.o. , MRN: [id number] , ADDRESS: [address] [place] [address] PCP Center, [facility] Patient Care Team: [facility], [facility] as PCP - General Lynnette Caffey, Charlies Constable, MD as PCP - Cardiology (Cardiology)  This Provider for this visit: Treatment Team:  Attending Provider: Kalman Shan, MD    05/14/2023 -   Chief Complaint  Patient presents with   Follow-up    F/up on COPD flare up not getting better.     HPI Sonia Bush 62 y.o. -female.  Seen in March 2024 for stable COPD follow-up she is severe COPD she uses oxygen periodically.  She is currently with oxygen.  Then  she saw Dr. Delton Coombes end of July 2024 which is just 2 weeks ago because she has been having body aches and fevers.  He put her on schedule azithromycin.  She tells me this year the new medications are Januvia [known to cause myalgias and fatigue] and also azithromycin.  She says she has been feeling unwell really since March 23, 2023.  This predated starting azithromycin.  Her main complaints fevers low-grade nausea body aches decreased appetite decreased weight.  Infection looks like she has lost weight.  She continues to have cough with thick sputum but she says the cough itself is not changed.  Associated with all this is that her shortness of breath she feels is worse.  Could be fatigue.  No edema no hemoptysis no orthopnea no paroxysmal nocturnal dyspnea.  In May 2024 she did have a CT chest without any lung cancer infiltrates.  Last echocardiogram was in 2022.  She continues with Symbicort and Incruse.  She is asking for something "please open up my lungs".  She is willing to try prednisone.  In the past she has been reluctant for prednisone because of her diabetes.  Pusle ox 92% RA at rest  CAT COPD Symptom & Quality of Life Score (GSK trademark) 0 is no burden. 5 is highest burden 01/17/2016  02/03/2018   Never Cough -> Cough all the time 3 2  No phlegm in chest -> Chest is full of phlegm 3 3  No chest tightness -> Chest feels very tight 2 2  No dyspnea for 1 flight stairs/hill -> Very dyspneic for 1 flight of stairs 4 5  No limitations for ADL at home -> Very limited with ADL at home 4 4  Confident leaving home -> Not at all confident leaving home 3 3  Sleep soundly -> Do not sleep soundly because of lung condition 3 0  Lots of Energy -> No energy at all 4 5  TOTAL Score (max 40)  26 24    Simple office walk 185 feet x  3 laps goal with forehead probe 12/14/2019   O2 used ra  Number laps completed 3  Comments about pace slow  Resting Pulse Ox/HR 97% and 90/min  Final Pulse Ox/HR 92% and  113/min  Desaturated </= 88% no  Desaturated <= 3% points Yes, 5 poin  Got Tachycardic >/= 90/min yes  Symptoms at end of test Mild dyspnea  Miscellaneous comments x     CT Chest data from date:LOCT 02/15/23  - personally visualized and independently interpreted : no - my findings are: see pffical  IMPRESSION: 1. Lung-RADS 2, benign appearance or behavior. Continue annual screening with low-dose chest CT without contrast in 12 months. 2. 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 3. Aortic Atherosclerosis (ICD10-I70.0) and Emphysema (ICD10-J43.9).     Electronically Signed   By: Kennith Center M.D.   On: 02/20/2023 12:46    Latest Reference Range & Units 11/14/11 02:25 12/01/20 15:08 03/23/23 18:03  Eosinophils Absolute 0.0 - 0.5 K/uL 0.2 0.1 0.0    PFT     Latest Ref Rng & Units 09/13/2021    2:06 PM 03/31/2015   11:16 AM  ILD indicators  FVC-Pre L 1.74  1.33   FVC-Predicted Pre % 63  45   FVC-Post L 1.64  1.97   FVC-Predicted Post % 59  68   DLCO uncorrected ml/min/mmHg 6.80  4.57   DLCO UNC %Pred % 39  25   DLCO Corrected ml/min/mmHg 6.80    DLCO COR %Pred % 39        LAB RESULTS last 96 hours No results found.  LAB RESULTS last 90 days Recent Results (from the past 2160 hour(s))  CBC with Differential     Status: Abnormal   Collection Time: 03/23/23  6:03 PM  Result Value Ref Range   WBC 8.7 4.0 - 10.5 K/uL   RBC 4.94 3.87 - 5.11 MIL/uL   Hemoglobin 15.8 (H) 12.0 - 15.0 g/dL   HCT 41.3 (H) 24.4 - 01.0 %   MCV 99.6 80.0 - 100.0 fL   MCH 32.0 26.0 - 34.0 pg   MCHC 32.1 30.0 - 36.0 g/dL   RDW 27.2 53.6 - 64.4 %   Platelets 187 150 - 400 K/uL   nRBC 0.0 0.0 - 0.2 %   Neutrophils Relative % 85 %   Neutro Abs 7.4 1.7 - 7.7 K/uL   Lymphocytes Relative  8 %   Lymphs Abs 0.7 0.7 - 4.0 K/uL   Monocytes Relative 6 %   Monocytes  Absolute 0.6 0.1 - 1.0 K/uL   Eosinophils Relative 0 %   Eosinophils Absolute 0.0 0.0 - 0.5 K/uL   Basophils Relative 0 %   Basophils Absolute 0.0 0.0 - 0.1 K/uL   Immature Granulocytes 1 %   Abs Immature Granulocytes 0.05 0.00 - 0.07 K/uL    Comment: Performed at [facility], [address], [place], [place] [address]  Troponin I (High Sensitivity)     Status: None   Collection Time: 03/23/23  6:03 PM  Result Value Ref Range   Troponin I (High Sensitivity) 12 <18 ng/L    Comment: (NOTE) Elevated high sensitivity troponin I (hsTnI) values and significant  changes across serial measurements may suggest ACS but many other  chronic and acute conditions are known to elevate hsTnI results.  Refer to the "Links" section for chest pain algorithms and additional  guidance. Performed at [facility], [address], [place], [place] [address]          has a past medical history of Anxiety, Asthmatic bronchitis, Chronic pain, COPD (chronic obstructive pulmonary disease) (HCC), Diabetes mellitus without complication (HCC), Hypertension, and Racing heart beat.   reports that she quit smoking about 5 years ago. Her smoking use included cigarettes. She started smoking about 51 years ago. She has a 69.1 pack-year smoking history. She has never used smokeless tobacco.  Past Surgical History:  Procedure Laterality Date   ECTOPIC PREGNANCY SURGERY     TUBAL LIGATION      Allergies  Allergen Reactions   Breztri Aerosphere [Budeson-Glycopyrrol-Formoterol] Swelling   Prevnar 13 [Pneumococcal 13-Val Conj Vacc]     Arm swelling    Immunization History  Administered Date(s) Administered   Fluad Quad(high Dose 65+) 07/10/2020   Influenza Split 08/01/2014, 07/27/2015   Influenza,inj,Quad PF,6+ Mos 07/24/2016, 08/01/2017, 07/01/2018, 07/02/2019, 08/01/2021   PFIZER(Purple Top)SARS-COV-2 Vaccination 12/17/2019, 01/11/2020, 09/07/2020   Pneumococcal  Conjugate-13 04/08/2015    Family History  Problem Relation Age of Onset   Asthma Daughter    Asthma Daughter    Skin cancer Father    Alzheimer's disease Father      Current Outpatient Medications:    albuterol (VENTOLIN HFA) 108 (90 Base) MCG/ACT inhaler, INHALE 2 PUFFS EVERY 6HRS AS NEEDED FOR WHEEZING/SHORTNESS OF BREATH (Patient taking differently: Inhale 2 puffs into the lungs every 6 (six) hours as needed for wheezing or shortness of breath. INHALE 2 PUFFS EVERY 6HRS AS NEEDED FOR WHEEZING/SHORTNESS OF BREATH), Disp: 8.5 each, Rfl: 10   aspirin EC 81 MG tablet, Take 1 tablet (81 mg total) by mouth daily. Swallow whole. (Patient taking differently: Take 81 mg by mouth daily as needed (swelling). Swallow whole.), Disp: 90 tablet, Rfl: 3   azithromycin (ZITHROMAX) 250 MG tablet, Take 1 tablet (250 mg total) by mouth daily., Disp: 30 tablet, Rfl: 1   Blood Glucose Monitoring Suppl (CONTOUR NEXT EZ) w/Device KIT, by Does not apply route., Disp: , Rfl:    Blood Pressure KIT, 1 kit by Does not apply route daily., Disp: 1 kit, Rfl: 0   budesonide-formoterol (SYMBICORT) 160-4.5 MCG/ACT inhaler, INHALE 2 PUFFS BY MOUTH INTO THE LUNGS IN THE MORNING AND AT BEDTIME., Disp: 10.2 each, Rfl: 11   clonazePAM (KLONOPIN) 1 MG tablet, Take 1 tablet (1 mg total) by mouth 2 (two) times daily., Disp: 10 tablet, Rfl: 0   cloNIDine (  CATAPRES) 0.2 MG tablet, Take 0.2 mg by mouth at bedtime. , Disp: , Rfl:    HYDROcodone-acetaminophen (NORCO) 10-325 MG tablet, Take 0.5-1 tablets by mouth every 6 (six) hours as needed for moderate pain or severe pain., Disp: , Rfl:    INCRUSE ELLIPTA 62.5 MCG/ACT AEPB, INHALE 1 PUFF BY MOUTH EVERY DAY, Disp: 90 each, Rfl: 1   JANUVIA 100 MG tablet, Take 100 mg by mouth daily., Disp: , Rfl:    MAGNESIUM PO, Take 200 mg by mouth every other day., Disp: , Rfl:    Multiple Vitamins-Minerals (MULTI FOR HER 50+ PO), Take 1 tablet by mouth daily., Disp: , Rfl:    naloxone (NARCAN)  nasal spray 4 mg/0.1 mL, Place 1 spray into the nose as directed. Accidental Overdose, Disp: , Rfl:    pantoprazole (PROTONIX) 40 MG tablet, TAKE 1 TABLET BY MOUTH EVERY DAY, Disp: 30 tablet, Rfl: 0   pioglitazone (ACTOS) 15 MG tablet, Take 15 mg by mouth daily., Disp: , Rfl:    Potassium (POTASSIMIN PO), Take 99 mg by mouth every other day., Disp: , Rfl:    pravastatin (PRAVACHOL) 10 MG tablet, Take 10 mg by mouth every evening., Disp: , Rfl:  No current facility-administered medications for this visit.  Facility-Administered Medications Ordered in Other Visits:    albuterol (PROVENTIL) (2.5 MG/3ML) 0.083% nebulizer solution 2.5 mg, 2.5 mg, Nebulization, Once, Kalman Shan, MD      Objective:   Vitals:   05/14/23 1301  BP: 120/80  Pulse: (!) 130  SpO2: 92%  Weight: 76 lb 6.4 oz (34.7 kg)  Height: 4\' 11"  (1.499 m)    Estimated body mass index is 15.43 kg/m as calculated from the following:   Height as of this encounter: 4\' 11"  (1.499 m).   Weight as of this encounter: 76 lb 6.4 oz (34.7 kg).  @WEIGHTCHANGE @  [employer]   05/14/23 1301  Weight: 76 lb 6.4 oz (34.7 kg)     Physical Exam   General: No distress.  Looks like she is lost weight.  She is thin O2 at rest: Using nasal cannula oxygen. Cane present: no Sitting in wheel chair: no Frail: YEs Obese: no Neuro: Alert and Oriented x 3. GCS 15. Speech normal Psych: Pleasant Resp:  Barrel Chest - YES.  Wheeze - no, Crackles - no, No overt respiratory distress CVS: Normal heart sounds. Murmurs - no Ext: Stigmata of Connective Tissue Disease - no HEENT: Normal upper airway. PEERL +. No post nasal drip        Assessment:     No diagnosis found.     Plan:     Patient Instructions     ICD-10-CM   1. 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 Former smoker  Z87.891     3. Screening for malignant neoplasm of respiratory organ  Z12.2     4. Need for RSV vaccination  Z29.11      COPD, very severe  (HCC)  Recent flare up June 2024; unclear if resolve  Plan - Continue Symbicort and Incruse as scheduled with albuterol as needed - Please take prednisone 40 mg x1 day, then 30 mg x1 day, then 20 mg x1 day, then 10 mg x1 day, and then 5 mg x1 day and stop - stop azithromycin (you only had  1 flare up in past year) - check cxr 05/14/2023    Myalgia Fatigue Decreased appetite Weight loss Dyspnea  ? Due to azithromycin  ?  Due to Januvia ? Other cause  Plan   - check quantiferon gold  - check cbc with diff, bmet, lft, IgE - check BNP, d-dimer, troponin, ESR, CK, procalcitonin - check echo   Former smoker Screening for malignant neoplasm of respiratory organ  -No cancer May 2024  Plan  -reeat CT May 2025   Follow-up severe COPD.  -Return to see  APP I 3 weeks to discuss above   FOLLOWUP Return in about 3 weeks (around 06/04/2023) for 30 min visit, copd, with any of the APPS.    SIGNATURE    Dr. Kalman Shan, M.D., F.C.C.P,  Pulmonary and Critical Care Medicine Staff Physician, [facility] Director - Interstitial Lung Disease  Program  Pulmonary Fibrosis [facility] [place], [place], [address]  Pager: [phone], If no answer or between  15:00h - 7:00h: call [phone] Telephone: [phone]  1:28 PM 05/14/2023

## 2023-05-14 NOTE — Patient Instructions (Addendum)
ICD-10-CM   1. 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 Former smoker  Z87.891     3. Screening for malignant neoplasm of respiratory organ  Z12.2     4. Need for RSV vaccination  Z29.11      COPD, very severe (HCC)  Recent flare up June 2024; unclear if resolve  Plan - Continue Symbicort and Incruse as scheduled with albuterol as needed - Please take prednisone 40 mg x1 day, then 30 mg x1 day, then 20 mg x1 day, then 10 mg x1 day, and then 5 mg x1 day and stop - stop azithromycin (you only had  1 flare up in past year) - check cxr 05/14/2023    Myalgia Fatigue Decreased appetite Weight loss Dyspnea  ? Due to azithromycin  ? Due to Januvia ? Other cause  Plan   - check quantiferon gold  - check cbc with diff, bmet, lft, IgE - check BNP, d-dimer, troponin, ESR, CK, procalcitonin - check echo   Former smoker Screening for malignant neoplasm of respiratory organ  -No cancer May 2024  Plan  -reeat CT May 2025   Follow-up severe COPD.  -Return to see  APP I 3 weeks to discuss above

## 2023-05-16 ENCOUNTER — Telehealth: Payer: Self-pay | Admitting: Internal Medicine

## 2023-05-16 ENCOUNTER — Ambulatory Visit (HOSPITAL_BASED_OUTPATIENT_CLINIC_OR_DEPARTMENT_OTHER)
Admission: RE | Admit: 2023-05-16 | Discharge: 2023-05-16 | Disposition: A | Discharge status: Home / Self Care | Payer: Medicare HMO | Source: Ambulatory Visit | Attending: Internal Medicine | Admitting: Internal Medicine

## 2023-05-16 DIAGNOSIS — R0609 Other forms of dyspnea: Secondary | ICD-10-CM

## 2023-05-16 DIAGNOSIS — R7989 Other specified abnormal findings of blood chemistry: Secondary | ICD-10-CM | POA: Diagnosis present

## 2023-05-16 MED ORDER — IOHEXOL 350 MG/ML SOLN
100.0000 mL | Freq: Once | INTRAVENOUS | Status: AC | PRN
  Administered 2023-05-16: 60 mL via INTRAVENOUS

## 2023-05-16 NOTE — Telephone Encounter (Signed)
Called and spoke with patient, advised of advise per Dr. Marchelle Gearing, she verbalized understanding.  She agreed to having the CTA done.  She stated she is not close to [facility] and would like to have it done at [facility] if at all possible.  Advised I would order the CTA and she would receive a call from one of our PCCS.  She verbalized understanding.  Nothing further needed.

## 2023-05-16 NOTE — Telephone Encounter (Signed)
Sodium chronically low and can explain general feeling on unwellness. Also ESR > 100 and unclear why, However, - d-dimer HIGH > 1.0  Plan  - get CTA chest 05/16/2023     Recent Results (from the past 2160 hour(s))  CBC with Differential     Status: Abnormal   Collection Time: 03/23/23  6:03 PM  Result Value Ref Range   WBC 8.7 4.0 - 10.5 K/uL   RBC 4.94 3.87 - 5.11 MIL/uL   Hemoglobin 15.8 (H) 12.0 - 15.0 g/dL   HCT 75.6 (H) 43.3 - 29.5 %   MCV 99.6 80.0 - 100.0 fL   MCH 32.0 26.0 - 34.0 pg   MCHC 32.1 30.0 - 36.0 g/dL   RDW 18.8 41.6 - 60.6 %   Platelets 187 150 - 400 K/uL   nRBC 0.0 0.0 - 0.2 %   Neutrophils Relative % 85 %   Neutro Abs 7.4 1.7 - 7.7 K/uL   Lymphocytes Relative 8 %   Lymphs Abs 0.7 0.7 - 4.0 K/uL   Monocytes Relative 6 %   Monocytes Absolute 0.6 0.1 - 1.0 K/uL   Eosinophils Relative 0 %   Eosinophils Absolute 0.0 0.0 - 0.5 K/uL   Basophils Relative 0 %   Basophils Absolute 0.0 0.0 - 0.1 K/uL   Immature Granulocytes 1 %   Abs Immature Granulocytes 0.05 0.00 - 0.07 K/uL    Comment: Performed at [facility], [address] [place], [place] [address]  Troponin I (High Sensitivity)     Status: None   Collection Time: 03/23/23  6:03 PM  Result Value Ref Range   Troponin I (High Sensitivity) 12 <18 ng/L    Comment: (NOTE) Elevated high sensitivity troponin I (hsTnI) values and significant  changes across serial measurements may suggest ACS but many other  chronic and acute conditions are known to elevate hsTnI results.  Refer to the "Links" section for chest pain algorithms and additional  guidance. Performed at [facility], [address], [place], [place] [address]   CK (Creatine Kinase)     Status: None   Collection Time: 05/14/23  2:14 PM  Result Value Ref Range   Total CK 26 7 - 177 U/L  Sed Rate (ESR)     Status: Abnormal   Collection Time: 05/14/23  2:14 PM  Result Value Ref Range   Sed Rate 112 (H)  0 - 30 mm/hr  D-Dimer, Quantitative     Status: Abnormal   Collection Time: 05/14/23  2:14 PM  Result Value Ref Range   D-Dimer, Quant 1.03 (H) <0.50 mcg/mL FEU    Comment: . The D-Dimer test is used frequently to exclude an acute PE or DVT. In patients with a low to moderate clinical risk assessment and a D-Dimer result <0.50 mcg/mL FEU, the likelihood of a PE or DVT is very low. However, a thromboembolic event should not be excluded solely on the basis of the D-Dimer level. Increased levels of D-Dimer are associated with a PE, DVT, DIC, malignancies, inflammation, sepsis, surgery, trauma, pregnancy, and advancing patient age. [Jama 2006 11:295(2):199-207] . For additional information, please refer to: http://education.[facility].com/faq/FAQ149 (This link is being provided for informational/ educational purposes only) .   B Nat Peptide     Status: Abnormal   Collection Time: 05/14/23  2:14 PM  Result Value Ref Range   Pro B Natriuretic peptide (BNP) 359.0 (H) 0.0 - 100.0 pg/mL  IgE     Status: None  Collection Time: 05/14/23  2:14 PM  Result Value Ref Range   IgE (Immunoglobulin E), Serum 6 <OR=114 kU/L  Hepatic function panel     Status: None   Collection Time: 05/14/23  2:14 PM  Result Value Ref Range   Total Bilirubin 0.3 0.2 - 1.2 mg/dL   Bilirubin, Direct 0.1 0.0 - 0.3 mg/dL   Alkaline Phosphatase 89 39 - 117 U/L   AST 18 0 - 37 U/L   ALT 14 0 - 35 U/L   Total Protein 7.6 6.0 - 8.3 g/dL   Albumin 3.8 3.5 - 5.2 g/dL  Basic Metabolic Panel (BMET)     Status: Abnormal   Collection Time: 05/14/23  2:14 PM  Result Value Ref Range   Sodium 126 (L) 135 - 145 mEq/L   Potassium 3.9 3.5 - 5.1 mEq/L   Chloride 90 (L) 96 - 112 mEq/L   CO2 27 19 - 32 mEq/L   Glucose, Bld 247 (H) 70 - 99 mg/dL   BUN 15 6 - 23 mg/dL   Creatinine, Ser 1.61 0.40 - 1.20 mg/dL   GFR 09.60 >45.40 mL/min    Comment: Calculated using the CKD-EPI Creatinine Equation (2021)   Calcium 10.1  8.4 - 10.5 mg/dL  CBC w/Diff     Status: Abnormal   Collection Time: 05/14/23  2:14 PM  Result Value Ref Range   WBC 9.5 4.0 - 10.5 K/uL   RBC 4.28 3.87 - 5.11 Mil/uL   Hemoglobin 13.7 12.0 - 15.0 g/dL   HCT 98.1 19.1 - 47.8 %   MCV 97.7 78.0 - 100.0 fl   MCHC 32.7 30.0 - 36.0 g/dL   RDW 29.5 62.1 - 30.8 %   Platelets 533.0 (H) 150.0 - 400.0 K/uL   Neutrophils Relative % 75.8 43.0 - 77.0 %   Lymphocytes Relative 8.1 (L) 12.0 - 46.0 %   Monocytes Relative 11.0 3.0 - 12.0 %   Eosinophils Relative 4.9 0.0 - 5.0 %   Basophils Relative 0.2 0.0 - 3.0 %   Neutro Abs 7.2 1.4 - 7.7 K/uL   Lymphs Abs 0.8 0.7 - 4.0 K/uL   Monocytes Absolute 1.0 0.1 - 1.0 K/uL   Eosinophils Absolute 0.5 0.0 - 0.7 K/uL   Basophils Absolute 0.0 0.0 - 0.1 K/uL  Extra Specimen     Status: None   Collection Time: 05/14/23  2:14 PM  Result Value Ref Range   Extra tube recieved      Comment: An extra specimen was received with no test requested. The specimen will be maintained in storage in case  additional testing is needed. Please call the client service department for further assistance. Marland Kitchen    Specimen type recieved Serum Separator (SST);

## 2023-05-17 NOTE — Progress Notes (Signed)
No blood clot or cancer or pneumonia on the CT chest. Only emphseyma/COPD. This is good news. We will NOT be callin you with results

## 2023-05-21 ENCOUNTER — Telehealth: Payer: Self-pay | Admitting: Internal Medicine

## 2023-05-21 LAB — TROPONIN T, HIGH SENSITIVITY (HS-TNT): Troponin T (Highly Sensitive): 18 ng/L (ref <15)

## 2023-05-21 NOTE — Telephone Encounter (Signed)
Spoke with the pt and made aware of response from MR  She verbalized understanding and will go to [facility]  Nothing further needed

## 2023-05-21 NOTE — Telephone Encounter (Signed)
  The CTA is negative for PE but with symptoms and positive troponin that is already days old, she should go to ER

## 2023-05-21 NOTE — Telephone Encounter (Signed)
TROPONIN and D-DIMER POSITIVE  FORWARDED TO MR AND OLALERE(DOD) FOR REVIEW

## 2023-05-21 NOTE — Telephone Encounter (Signed)
Please see other phone note dated today

## 2023-05-21 NOTE — Telephone Encounter (Signed)
Called and spoke with the pt  She had labs done 05/14/23-  TROPONIN and D-DIMER POSITIVE    CTA done 05/16/23 and this was negative  I made her aware of the results of this  She is c/o increased SOB, fatigue, decreased appetite and nausea since came off of the pred taper  She had temp of 99 last night  Denies any other symptoms  She is asking to be put back on pred  Please advise thanks!

## 2023-05-21 NOTE — Telephone Encounter (Signed)
Clydie Braun from [facility] is calling. They have a critical result for Dr.Ramaswamy. Reference number [id number] B. [phone]

## 2023-05-21 NOTE — Telephone Encounter (Signed)
Patient is calling stating that without the steroids she is having nausea, cold chills and lack of oxygen.She needs help with this.Please call and advise.[phone] or cell [phone]

## 2023-05-31 ENCOUNTER — Ambulatory Visit (HOSPITAL_COMMUNITY): Payer: Medicare HMO | Attending: Internal Medicine

## 2023-05-31 DIAGNOSIS — I5030 Unspecified diastolic (congestive) heart failure: Secondary | ICD-10-CM

## 2023-05-31 DIAGNOSIS — R0689 Other abnormalities of breathing: Secondary | ICD-10-CM | POA: Insufficient documentation

## 2023-05-31 DIAGNOSIS — R0600 Dyspnea, unspecified: Secondary | ICD-10-CM | POA: Insufficient documentation

## 2023-05-31 LAB — ECHOCARDIOGRAM COMPLETE
Area-P 1/2: 5.58 cm2
S' Lateral: 2.5 cm

## 2023-06-13 LAB — LAB REPORT - SCANNED: EGFR: 92

## 2023-06-17 ENCOUNTER — Ambulatory Visit (INDEPENDENT_AMBULATORY_CARE_PROVIDER_SITE_OTHER): Payer: Medicare HMO | Admitting: Adult Health

## 2023-06-17 ENCOUNTER — Encounter: Payer: Self-pay | Admitting: Adult Health

## 2023-06-17 VITALS — BP 134/62 | HR 98 | Ht 59.0 in | Wt 77.0 lb

## 2023-06-17 DIAGNOSIS — J449 Chronic obstructive pulmonary disease, unspecified: Secondary | ICD-10-CM

## 2023-06-17 DIAGNOSIS — J9611 Chronic respiratory failure with hypoxia: Secondary | ICD-10-CM

## 2023-06-17 DIAGNOSIS — J441 Chronic obstructive pulmonary disease with (acute) exacerbation: Secondary | ICD-10-CM | POA: Diagnosis not present

## 2023-06-17 MED ORDER — ALBUTEROL SULFATE HFA 108 (90 BASE) MCG/ACT IN AERS: 1.0000 | INHALATION_SPRAY | RESPIRATORY_TRACT | 3 refills | Status: DC | PRN

## 2023-06-17 MED ORDER — INCRUSE ELLIPTA 62.5 MCG/ACT IN AEPB
INHALATION_SPRAY | RESPIRATORY_TRACT | 2 refills | Status: DC
Start: 2023-06-17 — End: 2024-02-17

## 2023-06-17 MED ORDER — BUDESONIDE-FORMOTEROL FUMARATE 160-4.5 MCG/ACT IN AERO: INHALATION_SPRAY | RESPIRATORY_TRACT | 3 refills | Status: DC

## 2023-06-17 NOTE — Progress Notes (Unsigned)
no deformities or joint swelling noted.   Neuro: alert, no focal deficits noted.    Skin: Warm, no lesions or rashes    Lab Results:  CBC     BNP No results found for: "BNP"  ProBNP   Imaging: ECHOCARDIOGRAM COMPLETE  Result Date: 05/31/2023    ECHOCARDIOGRAM REPORT   Patient Name:   Sonia Bush Date of Exam: 05/31/2023 Medical Rec #:  [id number]           Height:       59.0 in Accession #:    [id number]          Weight:       76.4 lb Date of Birth:  [birth year]           BSA:          1.225 m Patient Age:    62 years            BP:           120/80 mmHg Patient Gender: F                   HR:           91 bpm. Exam Location:  [address] Procedure: 2D Echo, Cardiac Doppler, Color Doppler and 3D Echo Indications:    R06.00 Dyspnea  History:        Patient has prior history of Echocardiogram examinations, most                 recent 02/14/2021. COPD, Signs/Symptoms:Fatigue; Risk                 Factors:Former Smoker. Aortic atherosclerosis. Emphysema.                 Myalgia.  Sonographer:    Cathie Beams RCS Referring Phys: Kalman Shan IMPRESSIONS  1. Left ventricular ejection fraction, by estimation, is 60 to 65%. The left ventricle has normal function. The left ventricle has no regional wall motion abnormalities. Left ventricular diastolic parameters are consistent with Grade I diastolic dysfunction  (impaired relaxation).  2. Right ventricular systolic function is normal. The right ventricular size is normal.  3. The mitral valve is normal in structure. Trivial mitral valve regurgitation. No evidence of mitral stenosis.  4. The aortic valve is tricuspid. Aortic valve regurgitation is not visualized. Aortic valve sclerosis is present, with no evidence of aortic valve stenosis.  5. The inferior vena cava is normal in size with greater than 50% respiratory variability, suggesting right atrial pressure of 3 mmHg. Comparison(s): No significant change from prior study. FINDINGS  Left Ventricle: Left ventricular ejection fraction, by estimation, is 60 to 65%. The left ventricle has normal function. The left ventricle has no regional wall motion abnormalities. The left ventricular internal cavity size was normal in size. There is  no left ventricular hypertrophy. Left ventricular diastolic parameters are consistent with Grade I diastolic dysfunction (impaired relaxation). Right Ventricle: The right ventricular size is normal. Right ventricular systolic function is normal. Left Atrium: Left atrial size was normal in size. Right Atrium: Right atrial size was normal in size. Pericardium: Trivial pericardial effusion is present. Mitral Valve: The mitral valve is normal in structure. Trivial mitral valve regurgitation. No evidence of mitral valve stenosis. Tricuspid Valve: The tricuspid valve is normal in structure. Tricuspid valve regurgitation is trivial. No evidence of tricuspid stenosis. Aortic Valve: The aortic valve is tricuspid. Aortic valve regurgitation is not visualized. Aortic valve sclerosis is present,  @Patient  ID: Sonia Bush, female    DOB: 03-24-1961, 62 y.o.   MRN: [id number]  No chief complaint on file.   Referring provider: [facility]  HPI: 62 year old female former smoker followed for COPD and chronic respiratory failure  TEST/EVENTS :  PFTs September 13, 2021 showed FEV1 at 38%, ratio 47, FVC 63%, no significant bronchodilator response, DLCO at 39%.  CT chest May 16, 2023 negative for PE, diffuse emphysema, bibasilar atelectasis  06/17/2023 Follow up : COPD and O2 RF  Patient presents for a 1 month follow-up.  Patient was seen last visit with a COPD exacerbation.  Lab work showed an elevated D-dimer.  Subsequent CT chest was negative for PE.  Showed no acute process.  Chronic emphysema and bibasilar atelectasis.  Patient has underlying severe COPD She remains on Symbicort and Incruse.  Patient says that since last visit her Januvia has been stopped and she has been feeling much better.  She had had some low sodium levels as well.  And they have all returned back to normal.  She says that her appetite is improved and she has gained 5 pounds.  Feels so much better.  She denies any hemoptysis chest pain orthopnea or edema. Remains on O2 2D echo done on May 31, 2023 showed preserved EF, grade 1 diastolic dysfunction, RV systolic function normal and RV size normal.  Allergies  Allergen Reactions   Breztri Aerosphere [Budeson-Glycopyrrol-Formoterol] Swelling   Prevnar 13 [Pneumococcal 13-Val Conj Vacc]     Arm swelling    Immunization History  Administered Date(s) Administered   Fluad Quad(high Dose 65+) 07/10/2020   Influenza Split 08/01/2014, 07/27/2015   Influenza,inj,Quad PF,6+ Mos 07/24/2016, 08/01/2017, 07/01/2018, 07/02/2019, 08/01/2021   PFIZER(Purple Top)SARS-COV-2 Vaccination 12/17/2019, 01/11/2020, 09/07/2020   Pneumococcal Conjugate-13 04/08/2015    Past Medical History:  Diagnosis Date   Anxiety    Asthmatic bronchitis    Chronic  pain    COPD (chronic obstructive pulmonary disease) (HCC)    Diabetes mellitus without complication (HCC)    Hypertension    Racing heart beat     Tobacco History: Social History   Tobacco Use  Smoking Status Former   Current packs/day: 0.00   Average packs/day: 1.5 packs/day for 46.1 years (69.1 ttl pk-yrs)   Types: Cigarettes   Start date: 70   Quit date: 11/01/2017   Years since quitting: 5.6  Smokeless Tobacco Never  Tobacco Comments   Patient does Vape.  Has nicotine.  Vaping from 2019 to present.  Updated 12/11/22 amy marsh, cma   Counseling given: Not Answered Tobacco comments: Patient does Vape.  Has nicotine.  Vaping from 2019 to present.  Updated 12/11/22 amy marsh, cma   Outpatient Medications Prior to Visit  Medication Sig Dispense Refill   albuterol (VENTOLIN HFA) 108 (90 Base) MCG/ACT inhaler INHALE 2 PUFFS EVERY 6HRS AS NEEDED FOR WHEEZING/SHORTNESS OF BREATH (Patient taking differently: Inhale 2 puffs into the lungs every 6 (six) hours as needed for wheezing or shortness of breath. INHALE 2 PUFFS EVERY 6HRS AS NEEDED FOR WHEEZING/SHORTNESS OF BREATH) 8.5 each 10   aspirin EC 81 MG tablet Take 1 tablet (81 mg total) by mouth daily. Swallow whole. (Patient taking differently: Take 81 mg by mouth daily as needed (swelling). Swallow whole.) 90 tablet 3   Blood Glucose Monitoring Suppl (CONTOUR NEXT EZ) w/Device KIT by Does not apply route.     Blood Pressure KIT 1 kit by Does not apply route daily. 1 kit 0  no deformities or joint swelling noted.   Neuro: alert, no focal deficits noted.    Skin: Warm, no lesions or rashes    Lab Results:  CBC     BNP No results found for: "BNP"  ProBNP   Imaging: ECHOCARDIOGRAM COMPLETE  Result Date: 05/31/2023    ECHOCARDIOGRAM REPORT   Patient Name:   Sonia Bush Date of Exam: 05/31/2023 Medical Rec #:  [id number]           Height:       59.0 in Accession #:    [id number]          Weight:       76.4 lb Date of Birth:  [birth year]           BSA:          1.225 m Patient Age:    62 years            BP:           120/80 mmHg Patient Gender: F                   HR:           91 bpm. Exam Location:  [address] Procedure: 2D Echo, Cardiac Doppler, Color Doppler and 3D Echo Indications:    R06.00 Dyspnea  History:        Patient has prior history of Echocardiogram examinations, most                 recent 02/14/2021. COPD, Signs/Symptoms:Fatigue; Risk                 Factors:Former Smoker. Aortic atherosclerosis. Emphysema.                 Myalgia.  Sonographer:    Cathie Beams RCS Referring Phys: Kalman Shan IMPRESSIONS  1. Left ventricular ejection fraction, by estimation, is 60 to 65%. The left ventricle has normal function. The left ventricle has no regional wall motion abnormalities. Left ventricular diastolic parameters are consistent with Grade I diastolic dysfunction  (impaired relaxation).  2. Right ventricular systolic function is normal. The right ventricular size is normal.  3. The mitral valve is normal in structure. Trivial mitral valve regurgitation. No evidence of mitral stenosis.  4. The aortic valve is tricuspid. Aortic valve regurgitation is not visualized. Aortic valve sclerosis is present, with no evidence of aortic valve stenosis.  5. The inferior vena cava is normal in size with greater than 50% respiratory variability, suggesting right atrial pressure of 3 mmHg. Comparison(s): No significant change from prior study. FINDINGS  Left Ventricle: Left ventricular ejection fraction, by estimation, is 60 to 65%. The left ventricle has normal function. The left ventricle has no regional wall motion abnormalities. The left ventricular internal cavity size was normal in size. There is  no left ventricular hypertrophy. Left ventricular diastolic parameters are consistent with Grade I diastolic dysfunction (impaired relaxation). Right Ventricle: The right ventricular size is normal. Right ventricular systolic function is normal. Left Atrium: Left atrial size was normal in size. Right Atrium: Right atrial size was normal in size. Pericardium: Trivial pericardial effusion is present. Mitral Valve: The mitral valve is normal in structure. Trivial mitral valve regurgitation. No evidence of mitral valve stenosis. Tricuspid Valve: The tricuspid valve is normal in structure. Tricuspid valve regurgitation is trivial. No evidence of tricuspid stenosis. Aortic Valve: The aortic valve is tricuspid. Aortic valve regurgitation is not visualized. Aortic valve sclerosis is present,  no deformities or joint swelling noted.   Neuro: alert, no focal deficits noted.    Skin: Warm, no lesions or rashes    Lab Results:  CBC     BNP No results found for: "BNP"  ProBNP   Imaging: ECHOCARDIOGRAM COMPLETE  Result Date: 05/31/2023    ECHOCARDIOGRAM REPORT   Patient Name:   Sonia Bush Date of Exam: 05/31/2023 Medical Rec #:  [id number]           Height:       59.0 in Accession #:    [id number]          Weight:       76.4 lb Date of Birth:  [birth year]           BSA:          1.225 m Patient Age:    62 years            BP:           120/80 mmHg Patient Gender: F                   HR:           91 bpm. Exam Location:  [address] Procedure: 2D Echo, Cardiac Doppler, Color Doppler and 3D Echo Indications:    R06.00 Dyspnea  History:        Patient has prior history of Echocardiogram examinations, most                 recent 02/14/2021. COPD, Signs/Symptoms:Fatigue; Risk                 Factors:Former Smoker. Aortic atherosclerosis. Emphysema.                 Myalgia.  Sonographer:    Cathie Beams RCS Referring Phys: Kalman Shan IMPRESSIONS  1. Left ventricular ejection fraction, by estimation, is 60 to 65%. The left ventricle has normal function. The left ventricle has no regional wall motion abnormalities. Left ventricular diastolic parameters are consistent with Grade I diastolic dysfunction  (impaired relaxation).  2. Right ventricular systolic function is normal. The right ventricular size is normal.  3. The mitral valve is normal in structure. Trivial mitral valve regurgitation. No evidence of mitral stenosis.  4. The aortic valve is tricuspid. Aortic valve regurgitation is not visualized. Aortic valve sclerosis is present, with no evidence of aortic valve stenosis.  5. The inferior vena cava is normal in size with greater than 50% respiratory variability, suggesting right atrial pressure of 3 mmHg. Comparison(s): No significant change from prior study. FINDINGS  Left Ventricle: Left ventricular ejection fraction, by estimation, is 60 to 65%. The left ventricle has normal function. The left ventricle has no regional wall motion abnormalities. The left ventricular internal cavity size was normal in size. There is  no left ventricular hypertrophy. Left ventricular diastolic parameters are consistent with Grade I diastolic dysfunction (impaired relaxation). Right Ventricle: The right ventricular size is normal. Right ventricular systolic function is normal. Left Atrium: Left atrial size was normal in size. Right Atrium: Right atrial size was normal in size. Pericardium: Trivial pericardial effusion is present. Mitral Valve: The mitral valve is normal in structure. Trivial mitral valve regurgitation. No evidence of mitral valve stenosis. Tricuspid Valve: The tricuspid valve is normal in structure. Tricuspid valve regurgitation is trivial. No evidence of tricuspid stenosis. Aortic Valve: The aortic valve is tricuspid. Aortic valve regurgitation is not visualized. Aortic valve sclerosis is present,  @Patient  ID: Sonia Bush, female    DOB: 03-24-1961, 62 y.o.   MRN: [id number]  No chief complaint on file.   Referring provider: [facility]  HPI: 62 year old female former smoker followed for COPD and chronic respiratory failure  TEST/EVENTS :  PFTs September 13, 2021 showed FEV1 at 38%, ratio 47, FVC 63%, no significant bronchodilator response, DLCO at 39%.  CT chest May 16, 2023 negative for PE, diffuse emphysema, bibasilar atelectasis  06/17/2023 Follow up : COPD and O2 RF  Patient presents for a 1 month follow-up.  Patient was seen last visit with a COPD exacerbation.  Lab work showed an elevated D-dimer.  Subsequent CT chest was negative for PE.  Showed no acute process.  Chronic emphysema and bibasilar atelectasis.  Patient has underlying severe COPD She remains on Symbicort and Incruse.  Patient says that since last visit her Januvia has been stopped and she has been feeling much better.  She had had some low sodium levels as well.  And they have all returned back to normal.  She says that her appetite is improved and she has gained 5 pounds.  Feels so much better.  She denies any hemoptysis chest pain orthopnea or edema. Remains on O2 2D echo done on May 31, 2023 showed preserved EF, grade 1 diastolic dysfunction, RV systolic function normal and RV size normal.  Allergies  Allergen Reactions   Breztri Aerosphere [Budeson-Glycopyrrol-Formoterol] Swelling   Prevnar 13 [Pneumococcal 13-Val Conj Vacc]     Arm swelling    Immunization History  Administered Date(s) Administered   Fluad Quad(high Dose 65+) 07/10/2020   Influenza Split 08/01/2014, 07/27/2015   Influenza,inj,Quad PF,6+ Mos 07/24/2016, 08/01/2017, 07/01/2018, 07/02/2019, 08/01/2021   PFIZER(Purple Top)SARS-COV-2 Vaccination 12/17/2019, 01/11/2020, 09/07/2020   Pneumococcal Conjugate-13 04/08/2015    Past Medical History:  Diagnosis Date   Anxiety    Asthmatic bronchitis    Chronic  pain    COPD (chronic obstructive pulmonary disease) (HCC)    Diabetes mellitus without complication (HCC)    Hypertension    Racing heart beat     Tobacco History: Social History   Tobacco Use  Smoking Status Former   Current packs/day: 0.00   Average packs/day: 1.5 packs/day for 46.1 years (69.1 ttl pk-yrs)   Types: Cigarettes   Start date: 70   Quit date: 11/01/2017   Years since quitting: 5.6  Smokeless Tobacco Never  Tobacco Comments   Patient does Vape.  Has nicotine.  Vaping from 2019 to present.  Updated 12/11/22 amy marsh, cma   Counseling given: Not Answered Tobacco comments: Patient does Vape.  Has nicotine.  Vaping from 2019 to present.  Updated 12/11/22 amy marsh, cma   Outpatient Medications Prior to Visit  Medication Sig Dispense Refill   albuterol (VENTOLIN HFA) 108 (90 Base) MCG/ACT inhaler INHALE 2 PUFFS EVERY 6HRS AS NEEDED FOR WHEEZING/SHORTNESS OF BREATH (Patient taking differently: Inhale 2 puffs into the lungs every 6 (six) hours as needed for wheezing or shortness of breath. INHALE 2 PUFFS EVERY 6HRS AS NEEDED FOR WHEEZING/SHORTNESS OF BREATH) 8.5 each 10   aspirin EC 81 MG tablet Take 1 tablet (81 mg total) by mouth daily. Swallow whole. (Patient taking differently: Take 81 mg by mouth daily as needed (swelling). Swallow whole.) 90 tablet 3   Blood Glucose Monitoring Suppl (CONTOUR NEXT EZ) w/Device KIT by Does not apply route.     Blood Pressure KIT 1 kit by Does not apply route daily. 1 kit 0

## 2023-06-17 NOTE — Patient Instructions (Addendum)
Continue on Symbicort 2 puffs Twice daily   Continue on Incruse 1 puff daily  Albuterol inhaler As needed   Continue on Oxygen 2l/m to keep O2 sats >88-90% Follow up with Dr. Marchelle Gearing in 4-6  months and As needed

## 2023-06-18 NOTE — Assessment & Plan Note (Signed)
Recent COPD exacerbation now resolved   Plan  Patient Instructions  Continue on Symbicort 2 puffs Twice daily   Continue on Incruse 1 puff daily  Albuterol inhaler As needed   Continue on Oxygen 2l/m to keep O2 sats >88-90% Follow up with Dr. Marchelle Gearing in 4-6  months and As needed

## 2023-06-18 NOTE — Assessment & Plan Note (Signed)
Continue on O2 to keep sats >88-90%

## 2023-07-30 ENCOUNTER — Other Ambulatory Visit: Payer: Self-pay | Admitting: Internal Medicine

## 2023-08-13 ENCOUNTER — Other Ambulatory Visit: Payer: Self-pay | Admitting: Internal Medicine

## 2023-08-29 ENCOUNTER — Other Ambulatory Visit: Payer: Self-pay | Admitting: Internal Medicine

## 2023-09-03 NOTE — Telephone Encounter (Signed)
Patient is overdue for a visit.  This should be filled by PCP until she returns to cardiology.

## 2023-09-03 NOTE — Telephone Encounter (Signed)
Pt has had multiple attempts and still has not rescheduled appt. Please review for refill. Thank you

## 2024-01-01 ENCOUNTER — Other Ambulatory Visit: Payer: Self-pay | Admitting: Adult Health

## 2024-02-04 ENCOUNTER — Encounter: Payer: Self-pay | Admitting: Acute Care

## 2024-02-16 ENCOUNTER — Other Ambulatory Visit: Payer: Self-pay | Admitting: Adult Health

## 2024-02-16 DIAGNOSIS — J449 Chronic obstructive pulmonary disease, unspecified: Secondary | ICD-10-CM

## 2024-03-04 ENCOUNTER — Other Ambulatory Visit: Payer: Self-pay | Admitting: Acute Care

## 2024-03-04 DIAGNOSIS — Z122 Encounter for screening for malignant neoplasm of respiratory organs: Secondary | ICD-10-CM

## 2024-03-04 DIAGNOSIS — Z87891 Personal history of nicotine dependence: Secondary | ICD-10-CM

## 2024-03-26 ENCOUNTER — Ambulatory Visit (HOSPITAL_COMMUNITY)
Admission: RE | Admit: 2024-03-26 | Discharge: 2024-03-26 | Disposition: A | Discharge status: Home / Self Care | Source: Ambulatory Visit | Attending: Acute Care | Admitting: Acute Care

## 2024-03-26 ENCOUNTER — Ambulatory Visit (HOSPITAL_COMMUNITY)

## 2024-03-26 DIAGNOSIS — Z87891 Personal history of nicotine dependence: Secondary | ICD-10-CM | POA: Insufficient documentation

## 2024-03-26 DIAGNOSIS — Z122 Encounter for screening for malignant neoplasm of respiratory organs: Secondary | ICD-10-CM | POA: Diagnosis present

## 2024-04-06 ENCOUNTER — Other Ambulatory Visit: Payer: Self-pay

## 2024-04-06 DIAGNOSIS — Z87891 Personal history of nicotine dependence: Secondary | ICD-10-CM

## 2024-04-06 DIAGNOSIS — F17210 Nicotine dependence, cigarettes, uncomplicated: Secondary | ICD-10-CM

## 2024-04-06 DIAGNOSIS — Z122 Encounter for screening for malignant neoplasm of respiratory organs: Secondary | ICD-10-CM

## 2024-06-26 LAB — COLOGUARD: COLOGUARD: NEGATIVE

## 2024-08-26 ENCOUNTER — Other Ambulatory Visit: Payer: Self-pay | Admitting: Internal Medicine

## 2024-08-26 MED ORDER — BUDESONIDE-FORMOTEROL FUMARATE 160-4.5 MCG/ACT IN AERO: INHALATION_SPRAY | RESPIRATORY_TRACT | 3 refills | Status: AC

## 2024-08-26 NOTE — Telephone Encounter (Signed)
 Initially routed without pended medication.

## 2024-08-26 NOTE — Telephone Encounter (Signed)
 Copied from CRM #[id number]. Topic: Clinical - Medication Refill >> Aug 26, 2024  1:20 PM Daniela A wrote: Medication: budesonide -formoterol  (SYMBICORT ) 160-4.5 MCG/ACT inhaler  Has the patient contacted their pharmacy? yes (Agent: If no, request that the patient contact the pharmacy for the refill. If patient does not wish to contact the pharmacy document the reason why and proceed with request.) (Agent: If yes, when and what did the pharmacy advise?)  This is the patient's preferred pharmacy:  [id number] [place], [place] - [address] [place] [address] Phone: [phone] Fax: [phone]  Is this the correct pharmacy for this prescription? Yes If no, delete pharmacy and type the correct one.   Has the prescription been filled recently? No  Is the patient out of the medication? No - will likely be out by the end of the year - has scheduled an appt with Dr. Geronimo for 11/17/24, would like a refill to atleast last until then  Has the patient been seen for an appointment in the last year OR does the patient have an upcoming appointment? Yes  Can we respond through MyChart? No  Agent: Please be advised that Rx refills may take up to 3 business days. We ask that you follow-up with your pharmacy.

## 2024-11-17 ENCOUNTER — Ambulatory Visit: Admitting: Internal Medicine
